# Patient Record
Sex: Male | Born: 1950 | Race: White | Hispanic: No | Marital: Married | Smoking: Current every day smoker
Health system: Southern US, Community
[De-identification: ages and names within clinical notes are randomized; demographics above are authoritative.]

## PROBLEM LIST (undated history)

## (undated) DIAGNOSIS — M1990 Unspecified osteoarthritis, unspecified site: Secondary | ICD-10-CM

## (undated) DIAGNOSIS — I219 Acute myocardial infarction, unspecified: Secondary | ICD-10-CM

## (undated) DIAGNOSIS — J449 Chronic obstructive pulmonary disease, unspecified: Secondary | ICD-10-CM

## (undated) DIAGNOSIS — E785 Hyperlipidemia, unspecified: Secondary | ICD-10-CM

## (undated) DIAGNOSIS — C801 Malignant (primary) neoplasm, unspecified: Secondary | ICD-10-CM

## (undated) HISTORY — DX: Acute myocardial infarction, unspecified: I21.9

## (undated) HISTORY — PX: EYE SURGERY: SHX253

## (undated) HISTORY — DX: Hyperlipidemia, unspecified: E78.5

## (undated) HISTORY — DX: Unspecified osteoarthritis, unspecified site: M19.90

## (undated) HISTORY — DX: Chronic obstructive pulmonary disease, unspecified: J44.9

## (undated) HISTORY — PX: HERNIA REPAIR: SHX51

## (undated) HISTORY — PX: BACK SURGERY: SHX140

## (undated) HISTORY — PX: NECK SURGERY: SHX720

## (undated) HISTORY — PX: REFRACTIVE SURGERY: SHX103

## (undated) HISTORY — PX: CATARACT EXTRACTION: SUR2

## (undated) HISTORY — DX: Malignant (primary) neoplasm, unspecified: C80.1

---

## 1997-11-02 ENCOUNTER — Ambulatory Visit (HOSPITAL_COMMUNITY): Admission: RE | Admit: 1997-11-02 | Discharge: 1997-11-02 | Payer: Self-pay

## 1999-08-21 ENCOUNTER — Inpatient Hospital Stay (HOSPITAL_COMMUNITY): Admission: EM | Admit: 1999-08-21 | Discharge: 1999-08-22 | Payer: Self-pay | Admitting: Cardiology

## 1999-08-22 ENCOUNTER — Encounter: Payer: Self-pay | Admitting: Cardiology

## 2000-07-15 ENCOUNTER — Emergency Department (HOSPITAL_COMMUNITY): Admission: EM | Admit: 2000-07-15 | Discharge: 2000-07-15 | Payer: Self-pay | Admitting: Emergency Medicine

## 2001-01-23 ENCOUNTER — Encounter: Payer: Self-pay | Admitting: Neurosurgery

## 2001-01-23 ENCOUNTER — Ambulatory Visit (HOSPITAL_COMMUNITY): Admission: RE | Admit: 2001-01-23 | Discharge: 2001-01-23 | Payer: Self-pay | Admitting: Neurosurgery

## 2001-03-20 ENCOUNTER — Ambulatory Visit (HOSPITAL_COMMUNITY): Admission: RE | Admit: 2001-03-20 | Discharge: 2001-03-20 | Payer: Self-pay | Admitting: Neurosurgery

## 2001-03-20 ENCOUNTER — Encounter: Payer: Self-pay | Admitting: Neurosurgery

## 2001-06-19 ENCOUNTER — Inpatient Hospital Stay (HOSPITAL_COMMUNITY): Admission: EM | Admit: 2001-06-19 | Discharge: 2001-06-20 | Payer: Self-pay | Admitting: *Deleted

## 2001-06-19 ENCOUNTER — Encounter: Payer: Self-pay | Admitting: Cardiology

## 2005-01-17 ENCOUNTER — Encounter: Admission: RE | Admit: 2005-01-17 | Discharge: 2005-01-17 | Payer: Self-pay | Admitting: Neurosurgery

## 2005-01-18 ENCOUNTER — Encounter: Admission: RE | Admit: 2005-01-18 | Discharge: 2005-01-18 | Payer: Self-pay | Admitting: Neurosurgery

## 2005-01-21 ENCOUNTER — Encounter: Admission: RE | Admit: 2005-01-21 | Discharge: 2005-01-21 | Payer: Self-pay | Admitting: Neurosurgery

## 2005-05-11 ENCOUNTER — Emergency Department (HOSPITAL_COMMUNITY): Admission: EM | Admit: 2005-05-11 | Discharge: 2005-05-11 | Payer: Self-pay | Admitting: *Deleted

## 2005-07-10 ENCOUNTER — Ambulatory Visit: Payer: Self-pay | Admitting: Cardiology

## 2005-07-12 ENCOUNTER — Inpatient Hospital Stay (HOSPITAL_BASED_OUTPATIENT_CLINIC_OR_DEPARTMENT_OTHER): Admission: RE | Admit: 2005-07-12 | Discharge: 2005-07-12 | Payer: Self-pay | Admitting: Internal Medicine

## 2005-07-12 ENCOUNTER — Ambulatory Visit: Payer: Self-pay | Admitting: Internal Medicine

## 2005-07-30 ENCOUNTER — Ambulatory Visit: Payer: Self-pay | Admitting: Cardiology

## 2005-08-10 ENCOUNTER — Ambulatory Visit: Payer: Self-pay | Admitting: Internal Medicine

## 2005-09-26 ENCOUNTER — Ambulatory Visit: Payer: Self-pay | Admitting: Internal Medicine

## 2005-10-18 ENCOUNTER — Ambulatory Visit: Payer: Self-pay | Admitting: Cardiology

## 2005-10-18 ENCOUNTER — Ambulatory Visit: Payer: Self-pay

## 2005-10-29 ENCOUNTER — Ambulatory Visit: Payer: Self-pay | Admitting: Internal Medicine

## 2007-08-29 IMAGING — CR DG CHEST 2V
3 series · 3 of 3 positions shown · non-contrast
Comparison: Digitized exam on 06/19/01.

CLINICAL DATA: 54-year-old with chest pain, fever, flu-like symptoms.
 CHEST ? 2 VIEW:

[view not recorded (1 of 3)]
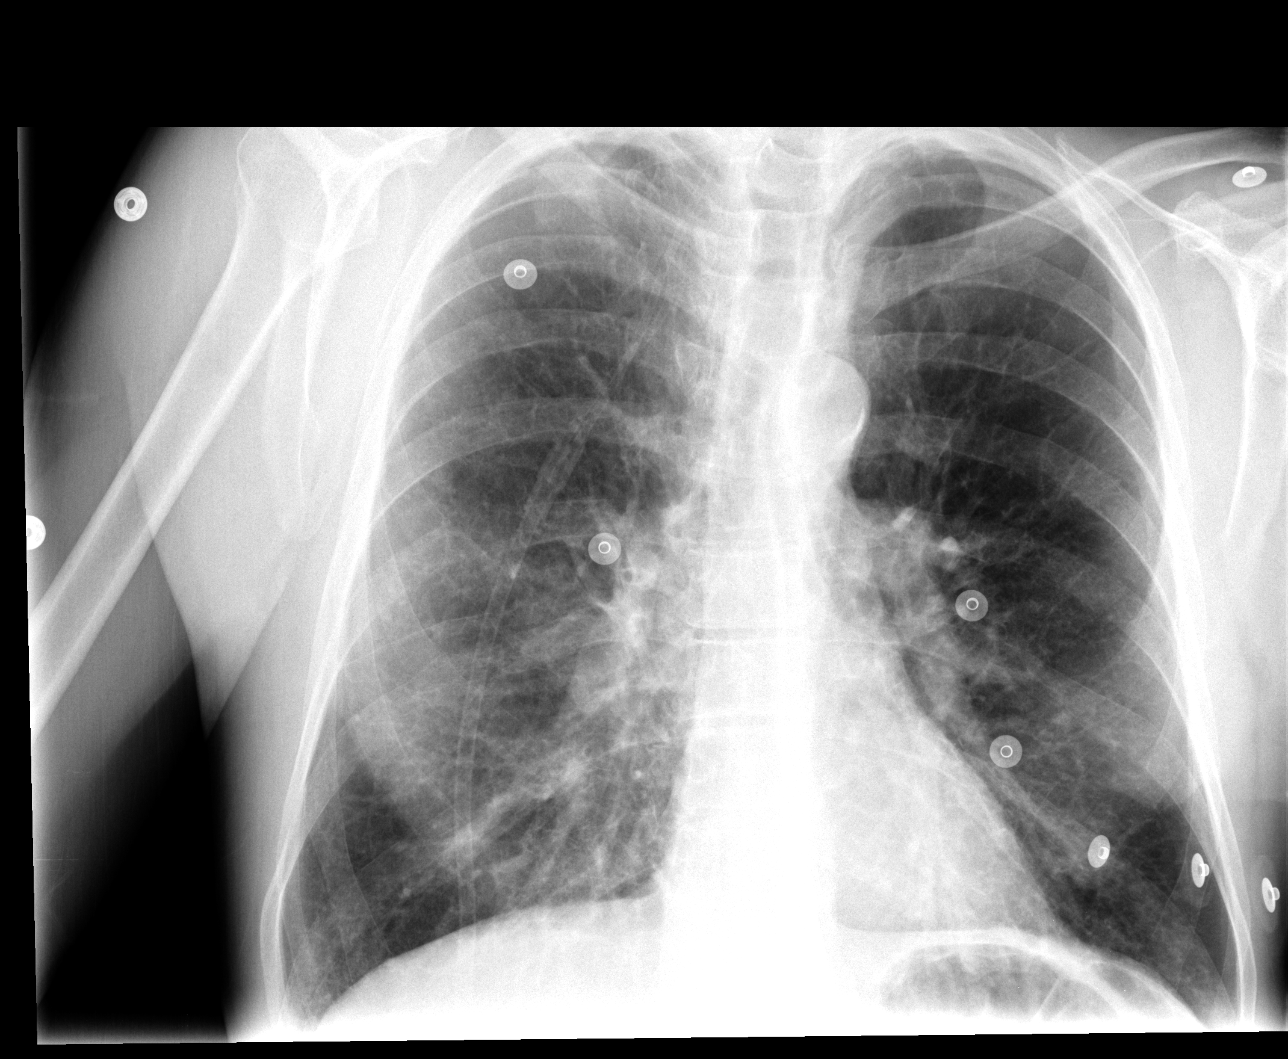

[view not recorded (2 of 3)]
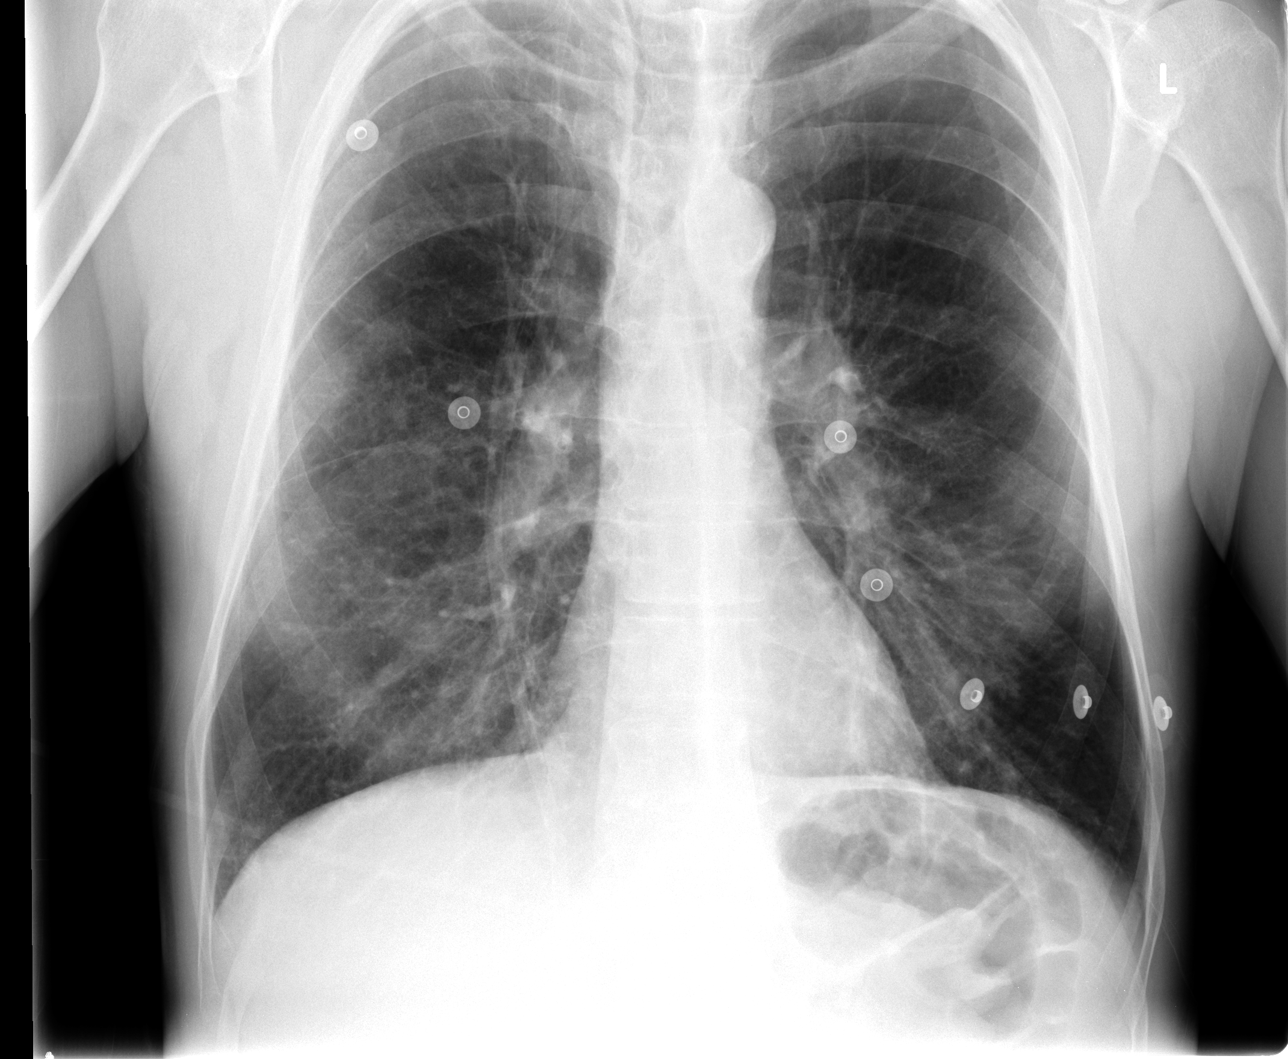

[view not recorded (3 of 3)]
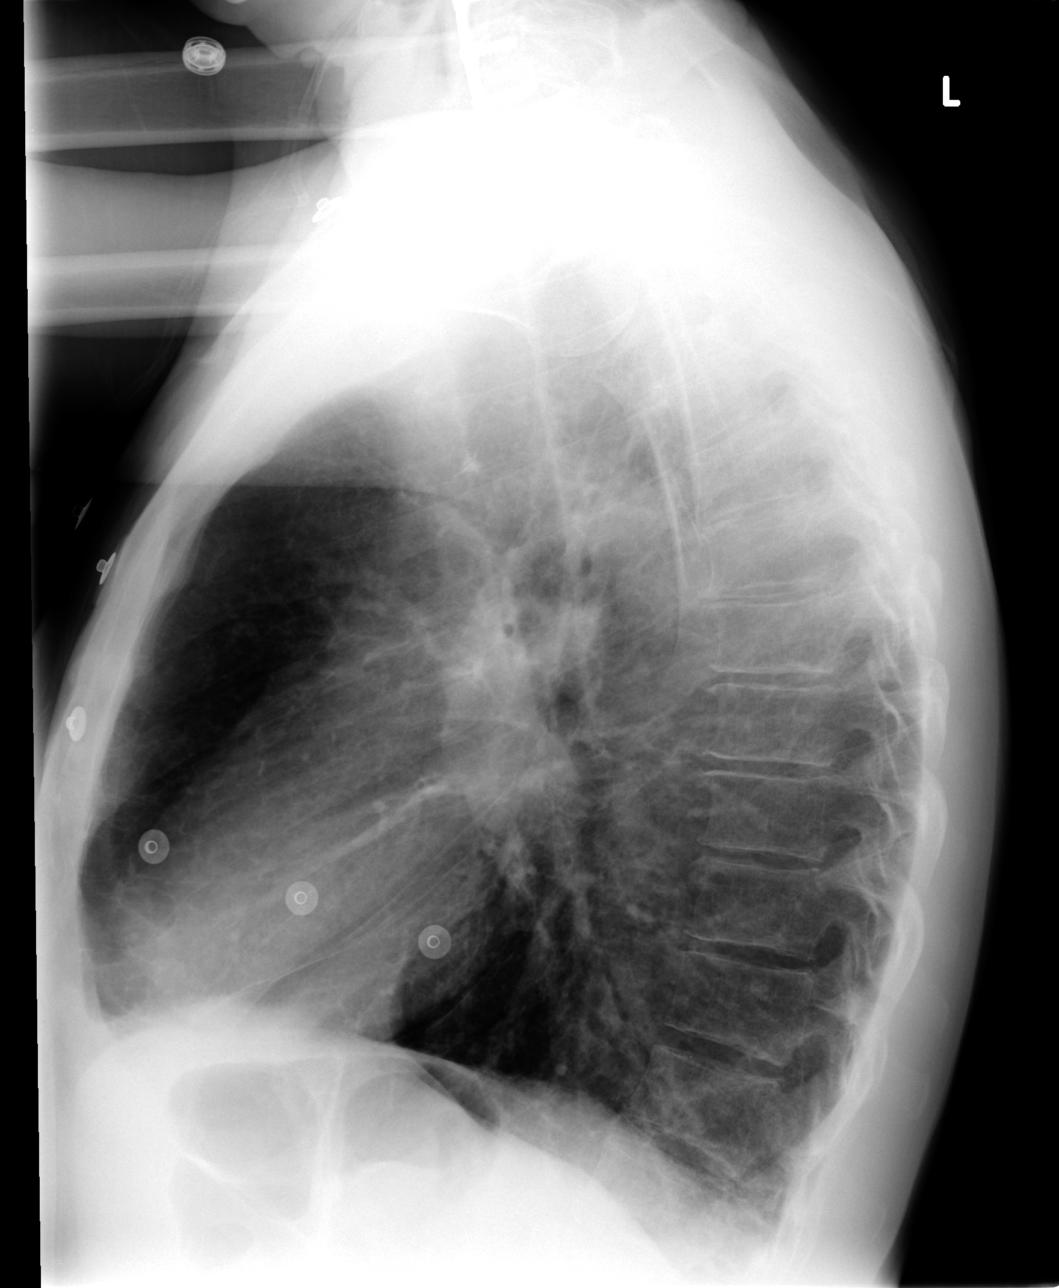

[3 of 3 positions shown; findings below may reference images not displayed]

FINDINGS: Heart is normal in size.  The lungs are hyperinflated.  There is prominence of interstitial markings, likely representing chronic interstitial lung disease.  No focal consolidation or pleural effusion identified.  Chronic bronchitic changes are noted.  Mild degenerative changes are seen in the spine.
IMPRESSION: Bronchitic changes in COPD.  No evidence for acute pulmonary abnormality.

## 2008-02-05 IMAGING — CT CT CHEST W/ CM
2 of 4 series · 15 of 36 positions shown, 18 images · IV contrast (APPLIED)
Comparison: none

HISTORY: Dyspnea, COPD, pulmonary hypertension, 25 pounds weight loss,
substernal pain and pressure

[Series 2: chest_routine 5.0 b40f st · axial · 0.63mm/px · z∈[-335,-40]mm · 12 of 69 slices shown, 15 images]
[im 5/69  mediastinal]
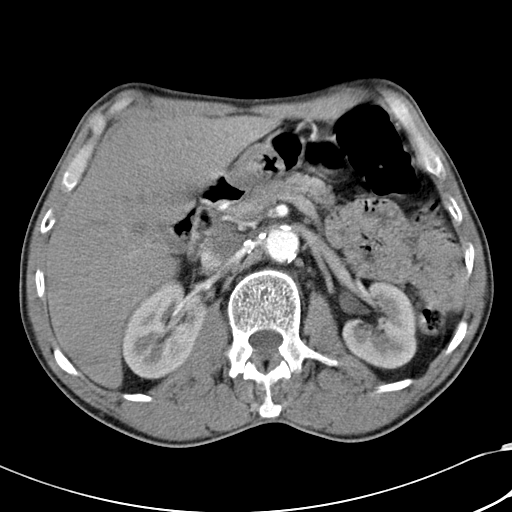
[im 5/69  lung]
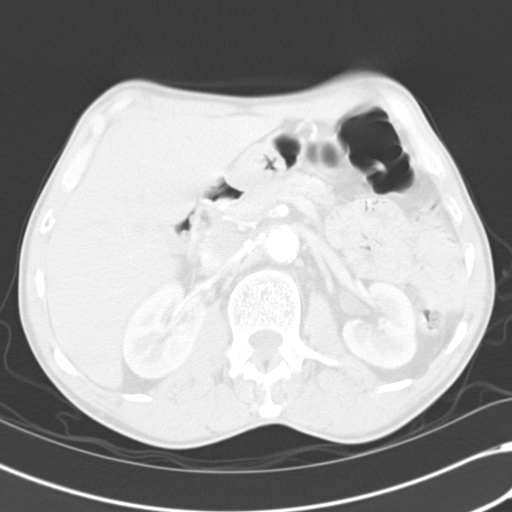
[im 10/69  lung]
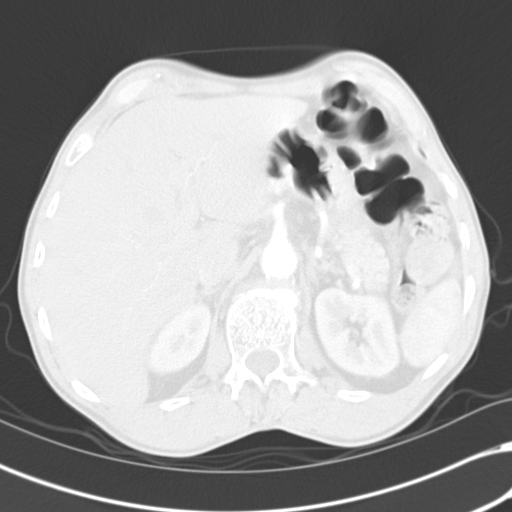
[im 15/69  lung]
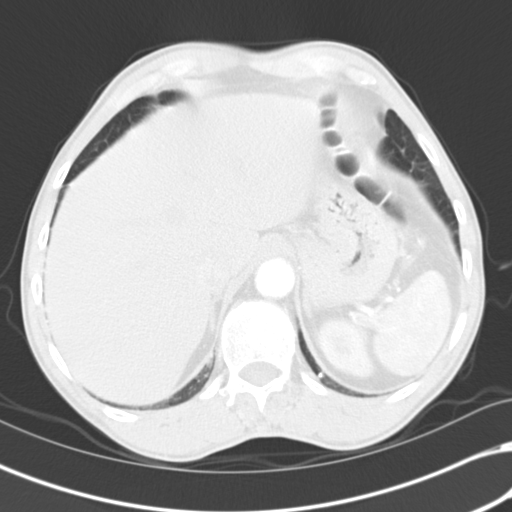
[im 20/69  lung]
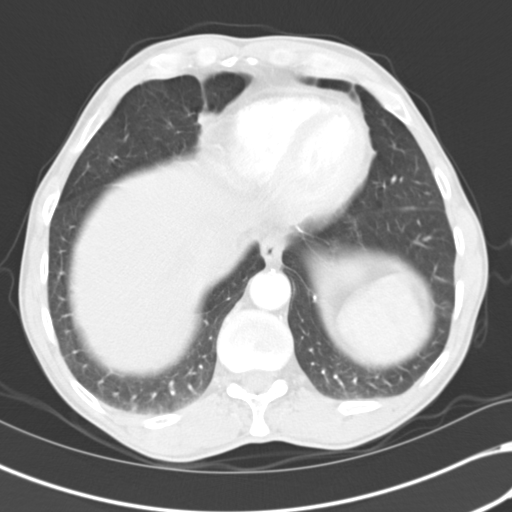
[im 25/69  mediastinal]
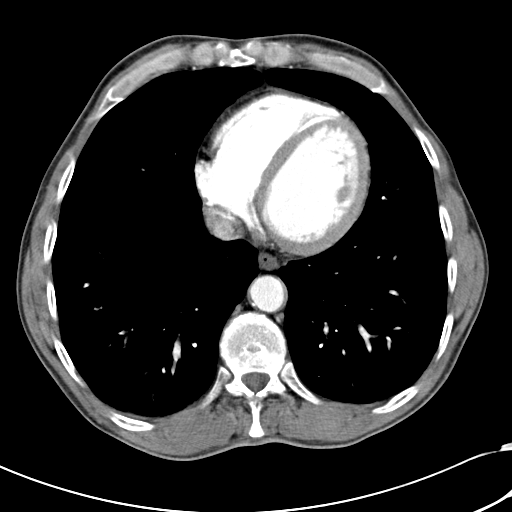
[im 25/69  lung]
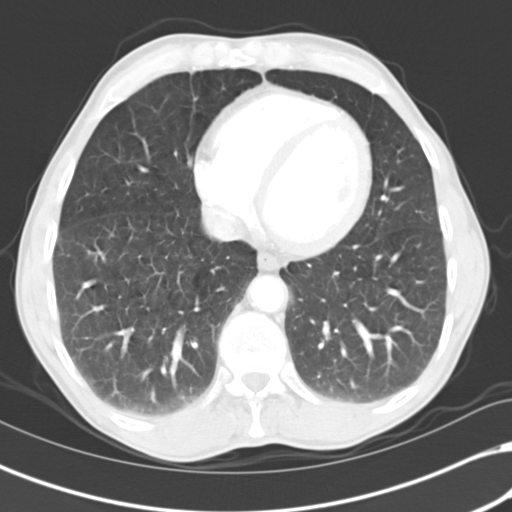
[im 30/69  lung]
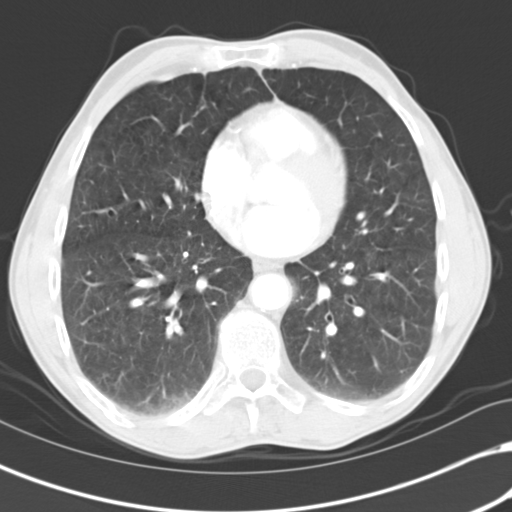
[im 39/69  lung]
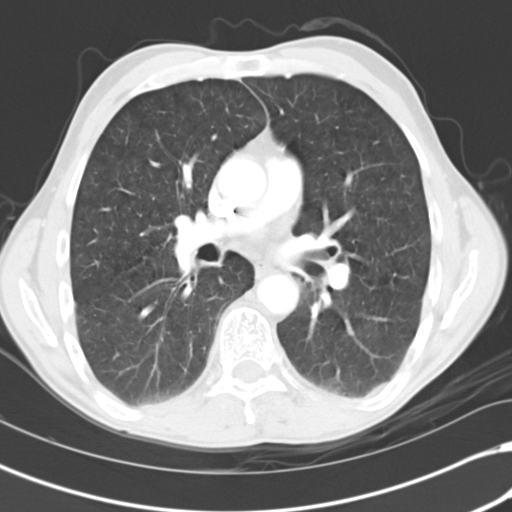
[im 44/69  lung]
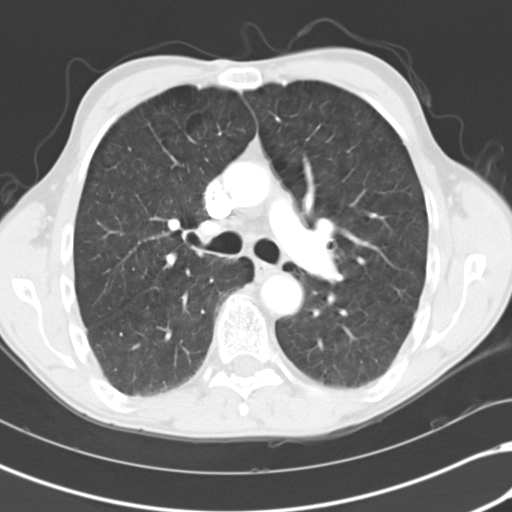
[im 49/69  mediastinal]
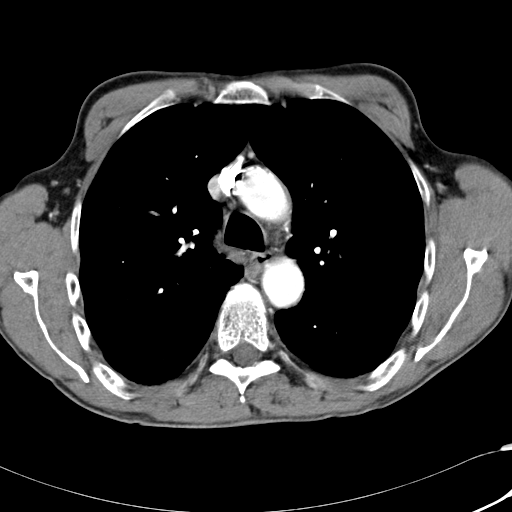
[im 49/69  lung]
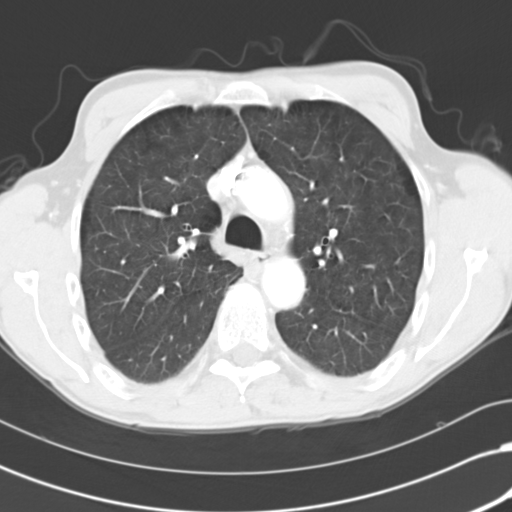
[im 54/69  lung]
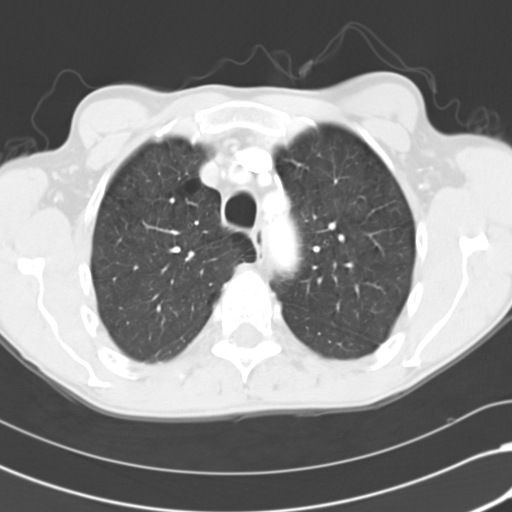
[im 59/69  lung]
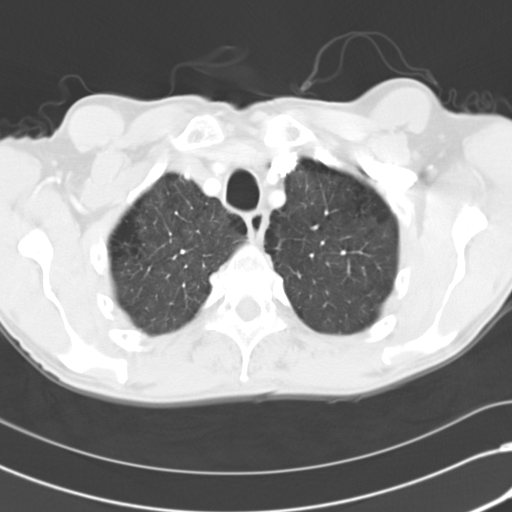
[im 64/69  lung]
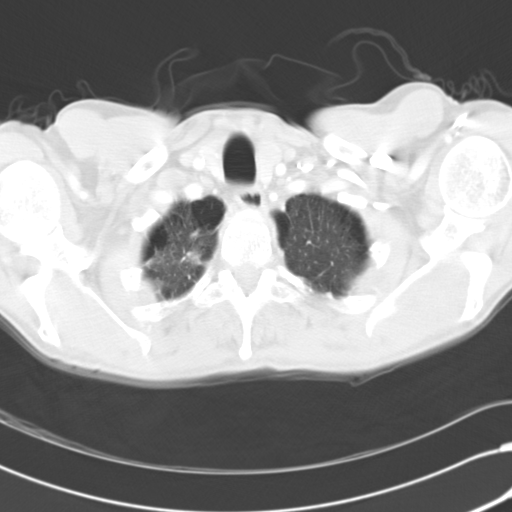

[Series 602: <mpr range> · coronal · 0.68mm/px · 3 of 39 slices shown]
[im 8/39  lung]
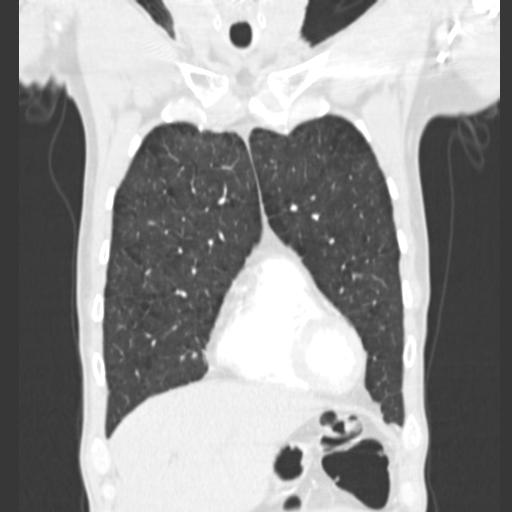
[im 16/39  lung]
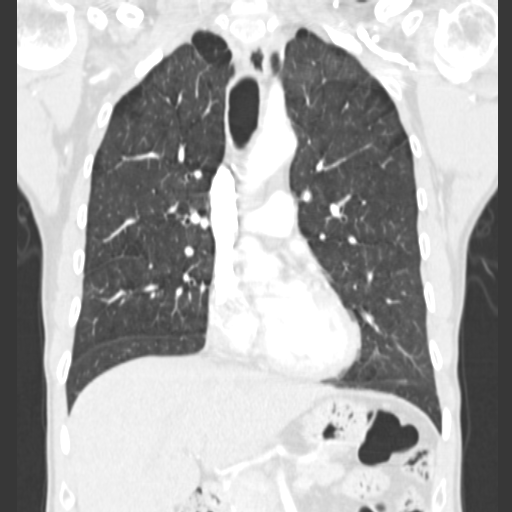
[im 23/39  lung]
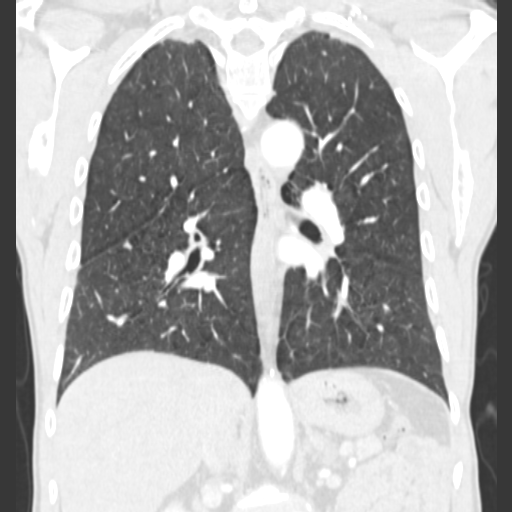

[15 of 36 positions shown; findings below may reference images not displayed]

CT CHEST WITH CONTRAST:

Multidetector helical CT imaging chest without priors for comparison.
Exam correlated with prior chest x-ray of 05/11/2005.
CT exam utilized 80 cc Mmnipaque-MAA.

Thoracic vascular structures patent.
Scattered normal sized mediastinal lymph nodes.
No thoracic adenopathy.
Scattered atherosclerotic calcifications aorta.
Visualized portion of upper abdomen unremarkable.
Stomach decompressed, unable to adequately assess wall thickness.
Biapical changes of COPD and parenchymal scarring.
Two tiny nonspecific nodular foci identified right lower lobe, 5 and 6 mm
diameter image 33, nonspecific.
Lungs otherwise clear.
Bones unremarkable.
IMPRESSION: COPD with 2 tiny nonspecific right lower lobe nodules.
Recommend followup CT chest in 6 months to establish stability.
No other significant thoracic abnormality.

## 2015-07-07 DIAGNOSIS — Z79899 Other long term (current) drug therapy: Secondary | ICD-10-CM | POA: Diagnosis not present

## 2015-07-07 DIAGNOSIS — Z87891 Personal history of nicotine dependence: Secondary | ICD-10-CM | POA: Diagnosis not present

## 2015-07-07 DIAGNOSIS — Z1389 Encounter for screening for other disorder: Secondary | ICD-10-CM | POA: Diagnosis not present

## 2015-07-07 DIAGNOSIS — J449 Chronic obstructive pulmonary disease, unspecified: Secondary | ICD-10-CM | POA: Diagnosis not present

## 2015-07-07 DIAGNOSIS — F329 Major depressive disorder, single episode, unspecified: Secondary | ICD-10-CM | POA: Diagnosis not present

## 2015-07-07 DIAGNOSIS — Z125 Encounter for screening for malignant neoplasm of prostate: Secondary | ICD-10-CM | POA: Diagnosis not present

## 2015-07-07 DIAGNOSIS — Z681 Body mass index (BMI) 19 or less, adult: Secondary | ICD-10-CM | POA: Diagnosis not present

## 2015-07-07 DIAGNOSIS — E7800 Pure hypercholesterolemia, unspecified: Secondary | ICD-10-CM | POA: Diagnosis not present

## 2015-07-21 DIAGNOSIS — R918 Other nonspecific abnormal finding of lung field: Secondary | ICD-10-CM | POA: Diagnosis not present

## 2015-07-21 DIAGNOSIS — Z122 Encounter for screening for malignant neoplasm of respiratory organs: Secondary | ICD-10-CM | POA: Diagnosis not present

## 2015-07-21 DIAGNOSIS — Z87891 Personal history of nicotine dependence: Secondary | ICD-10-CM | POA: Diagnosis not present

## 2015-07-21 DIAGNOSIS — I2510 Atherosclerotic heart disease of native coronary artery without angina pectoris: Secondary | ICD-10-CM | POA: Diagnosis not present

## 2015-07-21 DIAGNOSIS — J439 Emphysema, unspecified: Secondary | ICD-10-CM | POA: Diagnosis not present

## 2015-10-04 DIAGNOSIS — M539 Dorsopathy, unspecified: Secondary | ICD-10-CM | POA: Diagnosis not present

## 2015-10-04 DIAGNOSIS — Z681 Body mass index (BMI) 19 or less, adult: Secondary | ICD-10-CM | POA: Diagnosis not present

## 2015-10-04 DIAGNOSIS — Z79899 Other long term (current) drug therapy: Secondary | ICD-10-CM | POA: Diagnosis not present

## 2016-01-06 DIAGNOSIS — Z9181 History of falling: Secondary | ICD-10-CM | POA: Diagnosis not present

## 2016-01-06 DIAGNOSIS — F329 Major depressive disorder, single episode, unspecified: Secondary | ICD-10-CM | POA: Diagnosis not present

## 2016-01-06 DIAGNOSIS — M810 Age-related osteoporosis without current pathological fracture: Secondary | ICD-10-CM | POA: Diagnosis not present

## 2016-01-06 DIAGNOSIS — Z79899 Other long term (current) drug therapy: Secondary | ICD-10-CM | POA: Diagnosis not present

## 2016-01-06 DIAGNOSIS — F17200 Nicotine dependence, unspecified, uncomplicated: Secondary | ICD-10-CM | POA: Diagnosis not present

## 2016-01-06 DIAGNOSIS — Z720 Tobacco use: Secondary | ICD-10-CM | POA: Diagnosis not present

## 2016-01-06 DIAGNOSIS — R918 Other nonspecific abnormal finding of lung field: Secondary | ICD-10-CM | POA: Diagnosis not present

## 2016-01-06 DIAGNOSIS — J449 Chronic obstructive pulmonary disease, unspecified: Secondary | ICD-10-CM | POA: Diagnosis not present

## 2016-01-06 DIAGNOSIS — Z681 Body mass index (BMI) 19 or less, adult: Secondary | ICD-10-CM | POA: Diagnosis not present

## 2016-01-06 DIAGNOSIS — E7800 Pure hypercholesterolemia, unspecified: Secondary | ICD-10-CM | POA: Diagnosis not present

## 2016-01-20 DIAGNOSIS — F17210 Nicotine dependence, cigarettes, uncomplicated: Secondary | ICD-10-CM | POA: Diagnosis not present

## 2016-01-20 DIAGNOSIS — J439 Emphysema, unspecified: Secondary | ICD-10-CM | POA: Diagnosis not present

## 2016-01-20 DIAGNOSIS — Z122 Encounter for screening for malignant neoplasm of respiratory organs: Secondary | ICD-10-CM | POA: Diagnosis not present

## 2016-01-20 DIAGNOSIS — M4854XA Collapsed vertebra, not elsewhere classified, thoracic region, initial encounter for fracture: Secondary | ICD-10-CM | POA: Diagnosis not present

## 2016-01-20 DIAGNOSIS — Z87891 Personal history of nicotine dependence: Secondary | ICD-10-CM | POA: Diagnosis not present

## 2016-04-19 DIAGNOSIS — F419 Anxiety disorder, unspecified: Secondary | ICD-10-CM | POA: Diagnosis not present

## 2016-04-19 DIAGNOSIS — Z681 Body mass index (BMI) 19 or less, adult: Secondary | ICD-10-CM | POA: Diagnosis not present

## 2016-04-19 DIAGNOSIS — J449 Chronic obstructive pulmonary disease, unspecified: Secondary | ICD-10-CM | POA: Diagnosis not present

## 2016-04-19 DIAGNOSIS — E7800 Pure hypercholesterolemia, unspecified: Secondary | ICD-10-CM | POA: Diagnosis not present

## 2016-04-19 DIAGNOSIS — Z23 Encounter for immunization: Secondary | ICD-10-CM | POA: Diagnosis not present

## 2016-04-20 DIAGNOSIS — E7800 Pure hypercholesterolemia, unspecified: Secondary | ICD-10-CM | POA: Diagnosis not present

## 2016-05-24 DIAGNOSIS — I2510 Atherosclerotic heart disease of native coronary artery without angina pectoris: Secondary | ICD-10-CM

## 2016-05-24 HISTORY — DX: Atherosclerotic heart disease of native coronary artery without angina pectoris: I25.10

## 2016-05-28 DIAGNOSIS — I2510 Atherosclerotic heart disease of native coronary artery without angina pectoris: Secondary | ICD-10-CM | POA: Diagnosis not present

## 2016-05-29 DIAGNOSIS — I2510 Atherosclerotic heart disease of native coronary artery without angina pectoris: Secondary | ICD-10-CM | POA: Diagnosis not present

## 2016-07-16 DIAGNOSIS — I2510 Atherosclerotic heart disease of native coronary artery without angina pectoris: Secondary | ICD-10-CM | POA: Diagnosis not present

## 2016-07-16 DIAGNOSIS — E785 Hyperlipidemia, unspecified: Secondary | ICD-10-CM | POA: Diagnosis not present

## 2016-07-20 DIAGNOSIS — Z681 Body mass index (BMI) 19 or less, adult: Secondary | ICD-10-CM | POA: Diagnosis not present

## 2016-07-20 DIAGNOSIS — J449 Chronic obstructive pulmonary disease, unspecified: Secondary | ICD-10-CM | POA: Diagnosis not present

## 2016-07-20 DIAGNOSIS — Z1389 Encounter for screening for other disorder: Secondary | ICD-10-CM | POA: Diagnosis not present

## 2016-07-20 DIAGNOSIS — Z79899 Other long term (current) drug therapy: Secondary | ICD-10-CM | POA: Diagnosis not present

## 2016-07-20 DIAGNOSIS — E7800 Pure hypercholesterolemia, unspecified: Secondary | ICD-10-CM | POA: Diagnosis not present

## 2016-07-20 DIAGNOSIS — G4700 Insomnia, unspecified: Secondary | ICD-10-CM | POA: Diagnosis not present

## 2016-07-20 DIAGNOSIS — M545 Low back pain: Secondary | ICD-10-CM | POA: Diagnosis not present

## 2016-10-10 DIAGNOSIS — Z87891 Personal history of nicotine dependence: Secondary | ICD-10-CM | POA: Diagnosis not present

## 2016-10-10 DIAGNOSIS — J432 Centrilobular emphysema: Secondary | ICD-10-CM | POA: Diagnosis not present

## 2016-10-10 DIAGNOSIS — F17210 Nicotine dependence, cigarettes, uncomplicated: Secondary | ICD-10-CM | POA: Diagnosis not present

## 2016-10-10 DIAGNOSIS — R911 Solitary pulmonary nodule: Secondary | ICD-10-CM | POA: Diagnosis not present

## 2016-10-10 DIAGNOSIS — Z122 Encounter for screening for malignant neoplasm of respiratory organs: Secondary | ICD-10-CM | POA: Diagnosis not present

## 2016-10-25 DIAGNOSIS — Z79899 Other long term (current) drug therapy: Secondary | ICD-10-CM | POA: Diagnosis not present

## 2016-10-25 DIAGNOSIS — J449 Chronic obstructive pulmonary disease, unspecified: Secondary | ICD-10-CM | POA: Diagnosis not present

## 2016-10-25 DIAGNOSIS — E7800 Pure hypercholesterolemia, unspecified: Secondary | ICD-10-CM | POA: Diagnosis not present

## 2016-10-25 DIAGNOSIS — Z681 Body mass index (BMI) 19 or less, adult: Secondary | ICD-10-CM | POA: Diagnosis not present

## 2016-10-25 DIAGNOSIS — Z720 Tobacco use: Secondary | ICD-10-CM | POA: Diagnosis not present

## 2016-10-25 DIAGNOSIS — M5136 Other intervertebral disc degeneration, lumbar region: Secondary | ICD-10-CM | POA: Diagnosis not present

## 2016-10-25 DIAGNOSIS — F329 Major depressive disorder, single episode, unspecified: Secondary | ICD-10-CM | POA: Diagnosis not present

## 2016-12-21 DIAGNOSIS — Z125 Encounter for screening for malignant neoplasm of prostate: Secondary | ICD-10-CM | POA: Diagnosis not present

## 2016-12-21 DIAGNOSIS — Z136 Encounter for screening for cardiovascular disorders: Secondary | ICD-10-CM | POA: Diagnosis not present

## 2016-12-21 DIAGNOSIS — Z0000 Encounter for general adult medical examination without abnormal findings: Secondary | ICD-10-CM | POA: Diagnosis not present

## 2016-12-21 DIAGNOSIS — M810 Age-related osteoporosis without current pathological fracture: Secondary | ICD-10-CM | POA: Diagnosis not present

## 2016-12-21 DIAGNOSIS — E785 Hyperlipidemia, unspecified: Secondary | ICD-10-CM | POA: Diagnosis not present

## 2016-12-21 DIAGNOSIS — Z139 Encounter for screening, unspecified: Secondary | ICD-10-CM | POA: Diagnosis not present

## 2016-12-21 DIAGNOSIS — Z9181 History of falling: Secondary | ICD-10-CM | POA: Diagnosis not present

## 2016-12-21 DIAGNOSIS — Z1389 Encounter for screening for other disorder: Secondary | ICD-10-CM | POA: Diagnosis not present

## 2017-01-04 DIAGNOSIS — M810 Age-related osteoporosis without current pathological fracture: Secondary | ICD-10-CM | POA: Diagnosis not present

## 2017-01-10 DIAGNOSIS — E785 Hyperlipidemia, unspecified: Secondary | ICD-10-CM

## 2017-01-10 DIAGNOSIS — I10 Essential (primary) hypertension: Secondary | ICD-10-CM

## 2017-01-10 HISTORY — DX: Hyperlipidemia, unspecified: E78.5

## 2017-01-10 HISTORY — DX: Essential (primary) hypertension: I10

## 2017-01-31 DIAGNOSIS — Z79899 Other long term (current) drug therapy: Secondary | ICD-10-CM | POA: Diagnosis not present

## 2017-01-31 DIAGNOSIS — Z1389 Encounter for screening for other disorder: Secondary | ICD-10-CM | POA: Diagnosis not present

## 2017-01-31 DIAGNOSIS — M545 Low back pain: Secondary | ICD-10-CM | POA: Diagnosis not present

## 2017-01-31 DIAGNOSIS — Z681 Body mass index (BMI) 19 or less, adult: Secondary | ICD-10-CM | POA: Diagnosis not present

## 2017-01-31 DIAGNOSIS — E7800 Pure hypercholesterolemia, unspecified: Secondary | ICD-10-CM | POA: Diagnosis not present

## 2017-01-31 DIAGNOSIS — J449 Chronic obstructive pulmonary disease, unspecified: Secondary | ICD-10-CM | POA: Diagnosis not present

## 2017-01-31 DIAGNOSIS — F329 Major depressive disorder, single episode, unspecified: Secondary | ICD-10-CM | POA: Diagnosis not present

## 2017-01-31 DIAGNOSIS — Z9181 History of falling: Secondary | ICD-10-CM | POA: Diagnosis not present

## 2017-01-31 DIAGNOSIS — F419 Anxiety disorder, unspecified: Secondary | ICD-10-CM | POA: Diagnosis not present

## 2017-02-28 DIAGNOSIS — Z23 Encounter for immunization: Secondary | ICD-10-CM | POA: Diagnosis not present

## 2017-04-05 DIAGNOSIS — Z681 Body mass index (BMI) 19 or less, adult: Secondary | ICD-10-CM | POA: Diagnosis not present

## 2017-04-05 DIAGNOSIS — Z23 Encounter for immunization: Secondary | ICD-10-CM | POA: Diagnosis not present

## 2017-04-05 DIAGNOSIS — M5135 Other intervertebral disc degeneration, thoracolumbar region: Secondary | ICD-10-CM | POA: Diagnosis not present

## 2017-05-03 DIAGNOSIS — Z681 Body mass index (BMI) 19 or less, adult: Secondary | ICD-10-CM | POA: Diagnosis not present

## 2017-05-03 DIAGNOSIS — M545 Low back pain: Secondary | ICD-10-CM | POA: Diagnosis not present

## 2017-05-03 DIAGNOSIS — Z79899 Other long term (current) drug therapy: Secondary | ICD-10-CM | POA: Diagnosis not present

## 2017-05-03 DIAGNOSIS — M539 Dorsopathy, unspecified: Secondary | ICD-10-CM | POA: Diagnosis not present

## 2017-05-03 DIAGNOSIS — Z79891 Long term (current) use of opiate analgesic: Secondary | ICD-10-CM | POA: Diagnosis not present

## 2017-05-03 DIAGNOSIS — J449 Chronic obstructive pulmonary disease, unspecified: Secondary | ICD-10-CM | POA: Diagnosis not present

## 2017-06-03 DIAGNOSIS — R69 Illness, unspecified: Secondary | ICD-10-CM | POA: Diagnosis not present

## 2017-06-03 DIAGNOSIS — Z79899 Other long term (current) drug therapy: Secondary | ICD-10-CM | POA: Diagnosis not present

## 2017-06-03 DIAGNOSIS — Z681 Body mass index (BMI) 19 or less, adult: Secondary | ICD-10-CM | POA: Diagnosis not present

## 2017-06-03 DIAGNOSIS — M539 Dorsopathy, unspecified: Secondary | ICD-10-CM | POA: Diagnosis not present

## 2017-07-04 DIAGNOSIS — M545 Low back pain: Secondary | ICD-10-CM | POA: Diagnosis not present

## 2017-07-04 DIAGNOSIS — R69 Illness, unspecified: Secondary | ICD-10-CM | POA: Diagnosis not present

## 2017-07-04 DIAGNOSIS — J449 Chronic obstructive pulmonary disease, unspecified: Secondary | ICD-10-CM | POA: Diagnosis not present

## 2017-07-04 DIAGNOSIS — Z79891 Long term (current) use of opiate analgesic: Secondary | ICD-10-CM | POA: Diagnosis not present

## 2017-07-04 DIAGNOSIS — Z87891 Personal history of nicotine dependence: Secondary | ICD-10-CM | POA: Diagnosis not present

## 2017-07-04 DIAGNOSIS — M62838 Other muscle spasm: Secondary | ICD-10-CM | POA: Diagnosis not present

## 2017-07-04 DIAGNOSIS — M539 Dorsopathy, unspecified: Secondary | ICD-10-CM | POA: Diagnosis not present

## 2017-07-10 DIAGNOSIS — E785 Hyperlipidemia, unspecified: Secondary | ICD-10-CM | POA: Diagnosis not present

## 2017-07-10 DIAGNOSIS — R69 Illness, unspecified: Secondary | ICD-10-CM | POA: Diagnosis not present

## 2017-07-10 DIAGNOSIS — I2510 Atherosclerotic heart disease of native coronary artery without angina pectoris: Secondary | ICD-10-CM | POA: Diagnosis not present

## 2017-07-10 DIAGNOSIS — F17200 Nicotine dependence, unspecified, uncomplicated: Secondary | ICD-10-CM | POA: Insufficient documentation

## 2017-07-10 DIAGNOSIS — I10 Essential (primary) hypertension: Secondary | ICD-10-CM | POA: Diagnosis not present

## 2017-08-01 DIAGNOSIS — Z681 Body mass index (BMI) 19 or less, adult: Secondary | ICD-10-CM | POA: Diagnosis not present

## 2017-08-01 DIAGNOSIS — M545 Low back pain: Secondary | ICD-10-CM | POA: Diagnosis not present

## 2017-08-01 DIAGNOSIS — E7800 Pure hypercholesterolemia, unspecified: Secondary | ICD-10-CM | POA: Diagnosis not present

## 2017-08-01 DIAGNOSIS — Z79899 Other long term (current) drug therapy: Secondary | ICD-10-CM | POA: Diagnosis not present

## 2017-08-01 DIAGNOSIS — Z9181 History of falling: Secondary | ICD-10-CM | POA: Diagnosis not present

## 2017-08-01 DIAGNOSIS — R69 Illness, unspecified: Secondary | ICD-10-CM | POA: Diagnosis not present

## 2017-08-01 DIAGNOSIS — J449 Chronic obstructive pulmonary disease, unspecified: Secondary | ICD-10-CM | POA: Diagnosis not present

## 2017-09-03 DIAGNOSIS — Z681 Body mass index (BMI) 19 or less, adult: Secondary | ICD-10-CM | POA: Diagnosis not present

## 2017-09-03 DIAGNOSIS — M539 Dorsopathy, unspecified: Secondary | ICD-10-CM | POA: Diagnosis not present

## 2017-09-03 DIAGNOSIS — M545 Low back pain: Secondary | ICD-10-CM | POA: Diagnosis not present

## 2017-09-03 DIAGNOSIS — Z79899 Other long term (current) drug therapy: Secondary | ICD-10-CM | POA: Diagnosis not present

## 2017-09-03 DIAGNOSIS — J449 Chronic obstructive pulmonary disease, unspecified: Secondary | ICD-10-CM | POA: Diagnosis not present

## 2017-09-03 DIAGNOSIS — R69 Illness, unspecified: Secondary | ICD-10-CM | POA: Diagnosis not present

## 2017-09-03 DIAGNOSIS — M62838 Other muscle spasm: Secondary | ICD-10-CM | POA: Diagnosis not present

## 2017-10-03 DIAGNOSIS — Z79891 Long term (current) use of opiate analgesic: Secondary | ICD-10-CM | POA: Diagnosis not present

## 2017-10-03 DIAGNOSIS — M539 Dorsopathy, unspecified: Secondary | ICD-10-CM | POA: Diagnosis not present

## 2017-10-03 DIAGNOSIS — Z681 Body mass index (BMI) 19 or less, adult: Secondary | ICD-10-CM | POA: Diagnosis not present

## 2017-10-03 DIAGNOSIS — Z79899 Other long term (current) drug therapy: Secondary | ICD-10-CM | POA: Diagnosis not present

## 2017-10-03 DIAGNOSIS — J449 Chronic obstructive pulmonary disease, unspecified: Secondary | ICD-10-CM | POA: Diagnosis not present

## 2017-11-05 DIAGNOSIS — R252 Cramp and spasm: Secondary | ICD-10-CM | POA: Diagnosis not present

## 2017-11-05 DIAGNOSIS — Z681 Body mass index (BMI) 19 or less, adult: Secondary | ICD-10-CM | POA: Diagnosis not present

## 2017-11-05 DIAGNOSIS — M539 Dorsopathy, unspecified: Secondary | ICD-10-CM | POA: Diagnosis not present

## 2017-11-05 DIAGNOSIS — J449 Chronic obstructive pulmonary disease, unspecified: Secondary | ICD-10-CM | POA: Diagnosis not present

## 2017-11-05 DIAGNOSIS — Z79891 Long term (current) use of opiate analgesic: Secondary | ICD-10-CM | POA: Diagnosis not present

## 2017-11-05 DIAGNOSIS — Z87891 Personal history of nicotine dependence: Secondary | ICD-10-CM | POA: Diagnosis not present

## 2017-11-05 DIAGNOSIS — Z79899 Other long term (current) drug therapy: Secondary | ICD-10-CM | POA: Diagnosis not present

## 2017-11-07 DIAGNOSIS — Z87891 Personal history of nicotine dependence: Secondary | ICD-10-CM | POA: Diagnosis not present

## 2017-11-07 DIAGNOSIS — R69 Illness, unspecified: Secondary | ICD-10-CM | POA: Diagnosis not present

## 2017-12-05 DIAGNOSIS — J449 Chronic obstructive pulmonary disease, unspecified: Secondary | ICD-10-CM | POA: Diagnosis not present

## 2017-12-05 DIAGNOSIS — Z1339 Encounter for screening examination for other mental health and behavioral disorders: Secondary | ICD-10-CM | POA: Diagnosis not present

## 2017-12-05 DIAGNOSIS — Z79899 Other long term (current) drug therapy: Secondary | ICD-10-CM | POA: Diagnosis not present

## 2017-12-05 DIAGNOSIS — Z79891 Long term (current) use of opiate analgesic: Secondary | ICD-10-CM | POA: Diagnosis not present

## 2017-12-05 DIAGNOSIS — R69 Illness, unspecified: Secondary | ICD-10-CM | POA: Diagnosis not present

## 2017-12-05 DIAGNOSIS — M5412 Radiculopathy, cervical region: Secondary | ICD-10-CM | POA: Diagnosis not present

## 2017-12-05 DIAGNOSIS — Z720 Tobacco use: Secondary | ICD-10-CM | POA: Diagnosis not present

## 2017-12-05 DIAGNOSIS — M539 Dorsopathy, unspecified: Secondary | ICD-10-CM | POA: Diagnosis not present

## 2018-01-06 DIAGNOSIS — R69 Illness, unspecified: Secondary | ICD-10-CM | POA: Diagnosis not present

## 2018-01-06 DIAGNOSIS — J449 Chronic obstructive pulmonary disease, unspecified: Secondary | ICD-10-CM | POA: Diagnosis not present

## 2018-01-06 DIAGNOSIS — M539 Dorsopathy, unspecified: Secondary | ICD-10-CM | POA: Diagnosis not present

## 2018-01-06 DIAGNOSIS — Z79899 Other long term (current) drug therapy: Secondary | ICD-10-CM | POA: Diagnosis not present

## 2018-01-06 DIAGNOSIS — M5412 Radiculopathy, cervical region: Secondary | ICD-10-CM | POA: Diagnosis not present

## 2018-02-03 DIAGNOSIS — R69 Illness, unspecified: Secondary | ICD-10-CM | POA: Diagnosis not present

## 2018-02-03 DIAGNOSIS — Z79891 Long term (current) use of opiate analgesic: Secondary | ICD-10-CM | POA: Diagnosis not present

## 2018-02-03 DIAGNOSIS — M5412 Radiculopathy, cervical region: Secondary | ICD-10-CM | POA: Diagnosis not present

## 2018-02-03 DIAGNOSIS — Z681 Body mass index (BMI) 19 or less, adult: Secondary | ICD-10-CM | POA: Diagnosis not present

## 2018-02-03 DIAGNOSIS — M539 Dorsopathy, unspecified: Secondary | ICD-10-CM | POA: Diagnosis not present

## 2018-02-03 DIAGNOSIS — M545 Low back pain: Secondary | ICD-10-CM | POA: Diagnosis not present

## 2018-02-03 DIAGNOSIS — J449 Chronic obstructive pulmonary disease, unspecified: Secondary | ICD-10-CM | POA: Diagnosis not present

## 2018-02-04 DIAGNOSIS — J439 Emphysema, unspecified: Secondary | ICD-10-CM | POA: Diagnosis not present

## 2018-02-04 DIAGNOSIS — M545 Low back pain: Secondary | ICD-10-CM | POA: Diagnosis not present

## 2018-02-04 DIAGNOSIS — M438X6 Other specified deforming dorsopathies, lumbar region: Secondary | ICD-10-CM | POA: Diagnosis not present

## 2018-02-04 DIAGNOSIS — R0602 Shortness of breath: Secondary | ICD-10-CM | POA: Diagnosis not present

## 2018-02-13 DIAGNOSIS — Z136 Encounter for screening for cardiovascular disorders: Secondary | ICD-10-CM | POA: Diagnosis not present

## 2018-02-13 DIAGNOSIS — Z125 Encounter for screening for malignant neoplasm of prostate: Secondary | ICD-10-CM | POA: Diagnosis not present

## 2018-02-13 DIAGNOSIS — Z23 Encounter for immunization: Secondary | ICD-10-CM | POA: Diagnosis not present

## 2018-02-13 DIAGNOSIS — Z0000 Encounter for general adult medical examination without abnormal findings: Secondary | ICD-10-CM | POA: Diagnosis not present

## 2018-02-13 DIAGNOSIS — Z139 Encounter for screening, unspecified: Secondary | ICD-10-CM | POA: Diagnosis not present

## 2018-02-13 DIAGNOSIS — Z1331 Encounter for screening for depression: Secondary | ICD-10-CM | POA: Diagnosis not present

## 2018-02-13 DIAGNOSIS — E785 Hyperlipidemia, unspecified: Secondary | ICD-10-CM | POA: Diagnosis not present

## 2018-03-05 DIAGNOSIS — M539 Dorsopathy, unspecified: Secondary | ICD-10-CM | POA: Diagnosis not present

## 2018-03-05 DIAGNOSIS — Z681 Body mass index (BMI) 19 or less, adult: Secondary | ICD-10-CM | POA: Diagnosis not present

## 2018-03-05 DIAGNOSIS — M5412 Radiculopathy, cervical region: Secondary | ICD-10-CM | POA: Diagnosis not present

## 2018-03-05 DIAGNOSIS — J449 Chronic obstructive pulmonary disease, unspecified: Secondary | ICD-10-CM | POA: Diagnosis not present

## 2018-04-10 DIAGNOSIS — M5412 Radiculopathy, cervical region: Secondary | ICD-10-CM | POA: Diagnosis not present

## 2018-04-10 DIAGNOSIS — R69 Illness, unspecified: Secondary | ICD-10-CM | POA: Diagnosis not present

## 2018-04-10 DIAGNOSIS — Z79899 Other long term (current) drug therapy: Secondary | ICD-10-CM | POA: Diagnosis not present

## 2018-04-10 DIAGNOSIS — J449 Chronic obstructive pulmonary disease, unspecified: Secondary | ICD-10-CM | POA: Diagnosis not present

## 2018-04-10 DIAGNOSIS — M539 Dorsopathy, unspecified: Secondary | ICD-10-CM | POA: Diagnosis not present

## 2018-04-10 DIAGNOSIS — Z681 Body mass index (BMI) 19 or less, adult: Secondary | ICD-10-CM | POA: Diagnosis not present

## 2018-05-08 DIAGNOSIS — M539 Dorsopathy, unspecified: Secondary | ICD-10-CM | POA: Diagnosis not present

## 2018-05-08 DIAGNOSIS — M5412 Radiculopathy, cervical region: Secondary | ICD-10-CM | POA: Diagnosis not present

## 2018-05-08 DIAGNOSIS — Z79899 Other long term (current) drug therapy: Secondary | ICD-10-CM | POA: Diagnosis not present

## 2018-05-08 DIAGNOSIS — J449 Chronic obstructive pulmonary disease, unspecified: Secondary | ICD-10-CM | POA: Diagnosis not present

## 2018-05-08 DIAGNOSIS — Z681 Body mass index (BMI) 19 or less, adult: Secondary | ICD-10-CM | POA: Diagnosis not present

## 2018-05-08 DIAGNOSIS — I25119 Atherosclerotic heart disease of native coronary artery with unspecified angina pectoris: Secondary | ICD-10-CM | POA: Diagnosis not present

## 2018-05-08 DIAGNOSIS — R69 Illness, unspecified: Secondary | ICD-10-CM | POA: Diagnosis not present

## 2018-06-09 DIAGNOSIS — M5412 Radiculopathy, cervical region: Secondary | ICD-10-CM | POA: Diagnosis not present

## 2018-06-09 DIAGNOSIS — M539 Dorsopathy, unspecified: Secondary | ICD-10-CM | POA: Diagnosis not present

## 2018-06-09 DIAGNOSIS — J449 Chronic obstructive pulmonary disease, unspecified: Secondary | ICD-10-CM | POA: Diagnosis not present

## 2018-07-10 DIAGNOSIS — M5412 Radiculopathy, cervical region: Secondary | ICD-10-CM | POA: Diagnosis not present

## 2018-07-10 DIAGNOSIS — R252 Cramp and spasm: Secondary | ICD-10-CM | POA: Diagnosis not present

## 2018-07-10 DIAGNOSIS — M539 Dorsopathy, unspecified: Secondary | ICD-10-CM | POA: Diagnosis not present

## 2018-07-10 DIAGNOSIS — J449 Chronic obstructive pulmonary disease, unspecified: Secondary | ICD-10-CM | POA: Diagnosis not present

## 2019-04-02 ENCOUNTER — Other Ambulatory Visit: Payer: Self-pay | Admitting: Sports Medicine

## 2019-04-02 ENCOUNTER — Other Ambulatory Visit: Payer: Self-pay

## 2019-04-02 ENCOUNTER — Ambulatory Visit: Payer: Medicare Other | Admitting: Sports Medicine

## 2019-04-02 ENCOUNTER — Encounter: Payer: Self-pay | Admitting: Sports Medicine

## 2019-04-02 DIAGNOSIS — M7752 Other enthesopathy of left foot: Secondary | ICD-10-CM | POA: Diagnosis not present

## 2019-04-02 DIAGNOSIS — M25572 Pain in left ankle and joints of left foot: Secondary | ICD-10-CM

## 2019-04-02 DIAGNOSIS — M19079 Primary osteoarthritis, unspecified ankle and foot: Secondary | ICD-10-CM | POA: Diagnosis not present

## 2019-04-02 MED ORDER — TRIAMCINOLONE ACETONIDE 10 MG/ML IJ SUSP
10.0000 mg | Freq: Once | INTRAMUSCULAR | Status: AC
  Administered 2019-04-02: 10 mg

## 2019-04-02 NOTE — Progress Notes (Signed)
Subjective:  Corey Webb is a 68 y.o. male patient who presents to office for evaluation of left ankle pain. Patient complains of continued pain in the ankle for the last 8 or 9 months reports that the pain is 7-8 out of 10 sharp in nature reports that it constantly hurts worse when he is walking or standing reports that he was seen by orthopedics who gave him a brace and ordered a MRI.  Patient also reports that his PCP has given him anti-inflammatory of meloxicam but it did not help.  Patient denies any injury or trauma to the ankle.  No other pedal complaints noted.  ROS  There are no active problems to display for this patient.   Current Outpatient Medications on File Prior to Visit  Medication Sig Dispense Refill  . albuterol (ACCUNEB) 0.63 MG/3ML nebulizer solution Take 1 ampule by nebulization every 6 (six) hours as needed for wheezing.    Marland Kitchen aspirin 325 MG tablet Take by mouth.    . DULoxetine (CYMBALTA) 60 MG capsule TAKE 1 CAPSULE BY MOUTH DAILY    . meloxicam (MOBIC) 15 MG tablet Take 15 mg by mouth daily.    . Oxycodone HCl 10 MG TABS     . pravastatin (PRAVACHOL) 40 MG tablet TAKE 1 TABLET BY MOUTH AT BEDTIME FOR HIGH CHOLESTEROL    . verapamil (VERELAN PM) 180 MG 24 hr capsule Take by mouth.     No current facility-administered medications on file prior to visit.     Allergies  Allergen Reactions  . Atorvastatin Other (See Comments)    Leg pain Leg pain   . Clonazepam Other (See Comments)  . Ropinirole Other (See Comments)  . Varenicline Other (See Comments)    Objective:  General: Alert and oriented x3 in no acute distress  Dermatology: No open lesions bilateral lower extremities, no webspace macerations, no ecchymosis bilateral, all nails x 10 are well manicured.  Vascular: Dorsalis Pedis and Posterior Tibial pedal pulses palpable, varicosities bilateral.  Capillary Fill Time 5 seconds, scant pedal hair growth bilateral, no edema bilateral lower extremities,  Temperature gradient within normal limits.  Neurology: Gross sensation intact via light touch bilateral.  Musculoskeletal: Mild tenderness with palpation at left medial ankle.  Negative talar tilt, Negative tib-fib stress, No instability. No pain with calf compression bilateral. Range of motion within normal limits with mild guarding on left medial ankle. Strength within normal limits in all groups bilateral.   Gait: Antalgic gait  MRI 10/12 achilles bursiitis and mininal arthritis and ankle and subtalar jt  Assessment and Plan: Problem List Items Addressed This Visit    None    Visit Diagnoses    Capsulitis of ankle, left    -  Primary   Relevant Medications   aspirin 325 MG tablet   meloxicam (MOBIC) 15 MG tablet   Oxycodone HCl 10 MG TABS   Left ankle pain, unspecified chronicity       Arthritis of ankle           -Complete examination performed -MRI reviewed -Discussed treatment options capsulitis versus arthritis -After oral consent and aseptic prep, injected a mixture containing 1 ml of 2%  plain lidocaine, 1 ml 0.5% plain marcaine, 0.5 ml of kenalog 10 and 0.5 ml of dexamethasone phosphate into medial ankle without complication. Post-injection care discussed with patient.  -Advised patient to continue with ankle brace for at least 2 weeks and then after may slowly wean if his symptoms are doing better -  Continue with chronic pain medication of Oxy 10 which he takes for his chronic back issues -Continue with good supportive shoes daily -Patient to return to office if fails to improve after 2 weeks or sooner if condition worsens.  Landis Martins, DPM

## 2019-08-10 HISTORY — PX: HERNIA REPAIR: SHX51

## 2020-02-03 ENCOUNTER — Ambulatory Visit: Payer: Medicare Other | Admitting: Sports Medicine

## 2020-02-17 ENCOUNTER — Encounter: Payer: Self-pay | Admitting: Sports Medicine

## 2020-02-17 ENCOUNTER — Ambulatory Visit: Payer: Medicare Other | Admitting: Sports Medicine

## 2020-02-17 ENCOUNTER — Other Ambulatory Visit: Payer: Self-pay

## 2020-02-17 DIAGNOSIS — M25572 Pain in left ankle and joints of left foot: Secondary | ICD-10-CM | POA: Diagnosis not present

## 2020-02-17 DIAGNOSIS — M7752 Other enthesopathy of left foot: Secondary | ICD-10-CM

## 2020-02-17 DIAGNOSIS — M19079 Primary osteoarthritis, unspecified ankle and foot: Secondary | ICD-10-CM

## 2020-02-17 MED ORDER — DICLOFENAC SODIUM 75 MG PO TBEC: 75.0000 mg | DELAYED_RELEASE_TABLET | Freq: Two times a day (BID) | ORAL | 0 refills | Status: DC

## 2020-02-17 MED ORDER — TRIAMCINOLONE ACETONIDE 10 MG/ML IJ SUSP: 10.0000 mg | Freq: Once | INTRAMUSCULAR | Status: AC

## 2020-02-17 NOTE — Progress Notes (Signed)
Subjective:  Corey Webb is a 69 y.o. male patient who returns to office for follow up evaluation of left ankle pain.  Patient reports his ankle is not any better reports that the last injection helped for a little while but most days sore and tender and it hurts when he gets ready to step or walk on the foot and also it hurts with change in weather.  Patient also reports that his foot feels cold denies any numbness tingling or burning but does have a significant history of back problems with radiating pain.  No other pedal complaints noted.  Patient is assisted by wife this visit.  Patient Active Problem List   Diagnosis Date Noted  . Smoking 07/10/2017  . Dyslipidemia 01/10/2017  . Essential hypertension 01/10/2017  . CAD in native artery 05/24/2016    Current Outpatient Medications on File Prior to Visit  Medication Sig Dispense Refill  . acetaminophen (TYLENOL) 500 MG tablet Take 500 mg by mouth every 6 (six) hours as needed.    Marland Kitchen albuterol (ACCUNEB) 0.63 MG/3ML nebulizer solution Take 1 ampule by nebulization every 6 (six) hours as needed for wheezing.    Marland Kitchen aspirin 325 MG tablet Take by mouth.    . DULoxetine (CYMBALTA) 60 MG capsule TAKE 1 CAPSULE BY MOUTH DAILY    . Oxycodone HCl 10 MG TABS     . pravastatin (PRAVACHOL) 40 MG tablet TAKE 1 TABLET BY MOUTH AT BEDTIME FOR HIGH CHOLESTEROL    . verapamil (VERELAN PM) 180 MG 24 hr capsule Take by mouth.     No current facility-administered medications on file prior to visit.    Allergies  Allergen Reactions  . Atorvastatin Other (See Comments)    Leg pain Leg pain   . Clonazepam Other (See Comments)  . Ropinirole Other (See Comments)  . Varenicline Other (See Comments)    Objective:  General: Alert and oriented x3 in no acute distress  Dermatology: No open lesions bilateral lower extremities, no webspace macerations, no ecchymosis bilateral, all nails x 10 are well manicured.  Vascular: Dorsalis Pedis and Posterior  Tibial pedal pulses palpable, varicosities bilateral.  Capillary Fill Time 5 seconds, scant pedal hair growth bilateral, no edema bilateral lower extremities, Temperature gradient within normal limits but subjectively cold feet.  Neurology: Johney Maine sensation intact via light touch bilateral.  Musculoskeletal: Mild tenderness with palpation at left medial greater than lateral ankle.  Negative talar tilt, Negative tib-fib stress, No instability. No pain with calf compression bilateral. Range of motion within normal limits with mild guarding on left medial ankle. Strength within normal limits in all groups bilateral.    Assessment and Plan: Problem List Items Addressed This Visit    None    Visit Diagnoses    Capsulitis of ankle, left    -  Primary   Relevant Medications   acetaminophen (TYLENOL) 500 MG tablet   diclofenac (VOLTAREN) 75 MG EC tablet   Left ankle pain, unspecified chronicity       Arthritis of ankle           -Complete examination performed -Previous MRI reviewed -Re-Discussed treatment options capsulitis with arthritis -After oral consent and aseptic prep, injected a mixture containing 1 ml of 2% plain lidocaine, 1 ml 0.5% plain marcaine, 0.5 ml of kenalog 10 and 0.5 ml of dexamethasone phosphate into left medial ankle without complication. Post-injection care discussed with patient.  -Advised patient to ice as needed -Prescribed diclofenac to see if this will give  him some additional relief for his arthritis pain -Continue with chronic pain medication of Oxy 10 which he takes for his chronic back issues like previous -Continue with good supportive shoes daily -Patient to return to office in 4 weeks for follow-up evaluation or sooner if condition worsens.  Advised patient if fails to continue to improve may benefit from physical therapy.  Landis Martins, DPM

## 2020-03-16 ENCOUNTER — Encounter: Payer: Self-pay | Admitting: Sports Medicine

## 2020-03-16 ENCOUNTER — Other Ambulatory Visit: Payer: Self-pay

## 2020-03-16 ENCOUNTER — Ambulatory Visit: Payer: Medicare Other | Admitting: Sports Medicine

## 2020-03-16 DIAGNOSIS — M19079 Primary osteoarthritis, unspecified ankle and foot: Secondary | ICD-10-CM | POA: Diagnosis not present

## 2020-03-16 DIAGNOSIS — M25572 Pain in left ankle and joints of left foot: Secondary | ICD-10-CM | POA: Diagnosis not present

## 2020-03-16 DIAGNOSIS — M7752 Other enthesopathy of left foot: Secondary | ICD-10-CM | POA: Diagnosis not present

## 2020-03-16 NOTE — Progress Notes (Signed)
Subjective:  Corey Webb is a 69 y.o. male patient who returns to office for follow up evaluation of left ankle pain.  Patient reports the injection did not help reports that his ankle stayed numb for about a day and the pain continues with sharp shooting pain at his ankle with dull aching pain.  No other pedal complaints noted.   Patient Active Problem List   Diagnosis Date Noted  . Smoking 07/10/2017  . Dyslipidemia 01/10/2017  . Essential hypertension 01/10/2017  . CAD in native artery 05/24/2016    Current Outpatient Medications on File Prior to Visit  Medication Sig Dispense Refill  . acetaminophen (TYLENOL) 500 MG tablet Take 500 mg by mouth every 6 (six) hours as needed.    Marland Kitchen albuterol (ACCUNEB) 0.63 MG/3ML nebulizer solution Take 1 ampule by nebulization every 6 (six) hours as needed for wheezing.    Marland Kitchen aspirin 325 MG tablet Take by mouth.    . diclofenac (VOLTAREN) 75 MG EC tablet Take 1 tablet (75 mg total) by mouth 2 (two) times daily. 30 tablet 0  . DULoxetine (CYMBALTA) 60 MG capsule TAKE 1 CAPSULE BY MOUTH DAILY    . Oxycodone HCl 10 MG TABS     . pravastatin (PRAVACHOL) 40 MG tablet TAKE 1 TABLET BY MOUTH AT BEDTIME FOR HIGH CHOLESTEROL    . verapamil (VERELAN PM) 180 MG 24 hr capsule Take by mouth.     Current Facility-Administered Medications on File Prior to Visit  Medication Dose Route Frequency Provider Last Rate Last Admin  . triamcinolone acetonide (KENALOG) 10 MG/ML injection 10 mg  10 mg Other Once Landis Martins, DPM        Allergies  Allergen Reactions  . Atorvastatin Other (See Comments)    Leg pain Leg pain   . Clonazepam Other (See Comments)  . Ropinirole Other (See Comments)  . Varenicline Other (See Comments)    Objective:  General: Alert and oriented x3 in no acute distress  Dermatology: No open lesions bilateral lower extremities, no webspace macerations, no ecchymosis bilateral, all nails x 10 are well manicured.  Vascular: Dorsalis  Pedis and Posterior Tibial pedal pulses palpable, varicosities bilateral.  Capillary Fill Time 5 seconds, scant pedal hair growth bilateral, no edema bilateral lower extremities, Temperature gradient within normal limits but subjectively cold feet like previous.  Neurology: Johney Maine sensation intact via light touch bilateral.  Musculoskeletal: Mild tenderness with palpation at left medial greater than lateral ankle similar to previous. Range of motion within normal limits with mild guarding on left medial ankle. Strength within normal limits in all groups bilateral.    Assessment and Plan: Problem List Items Addressed This Visit    None    Visit Diagnoses    Capsulitis of ankle, left    -  Primary   Left ankle pain, unspecified chronicity       Arthritis of ankle           -Complete examination performed -Re-Discussed continued care for capsulitis with arthritis -Dispensed ankle gauntlet for patient to use as instructed -Continue with chronic pain medication of Oxy 10 which he takes for his chronic back issues like previous -Advised patient if pain fails to improve may benefit from physical therapy and ankle -Continue with good supportive shoes daily -Patient to return to office as needed or sooner if problems or issues arise  Landis Martins, DPM

## 2020-09-19 DIAGNOSIS — M7989 Other specified soft tissue disorders: Secondary | ICD-10-CM | POA: Diagnosis not present

## 2020-09-19 DIAGNOSIS — M79604 Pain in right leg: Secondary | ICD-10-CM | POA: Diagnosis not present

## 2020-09-19 DIAGNOSIS — I739 Peripheral vascular disease, unspecified: Secondary | ICD-10-CM | POA: Diagnosis not present

## 2020-09-27 DIAGNOSIS — U071 COVID-19: Secondary | ICD-10-CM | POA: Diagnosis not present

## 2020-10-06 DIAGNOSIS — M545 Low back pain, unspecified: Secondary | ICD-10-CM | POA: Diagnosis not present

## 2020-10-12 DIAGNOSIS — Z79899 Other long term (current) drug therapy: Secondary | ICD-10-CM | POA: Diagnosis not present

## 2020-10-12 DIAGNOSIS — M48061 Spinal stenosis, lumbar region without neurogenic claudication: Secondary | ICD-10-CM | POA: Diagnosis not present

## 2020-10-12 DIAGNOSIS — Z6820 Body mass index (BMI) 20.0-20.9, adult: Secondary | ICD-10-CM | POA: Diagnosis not present

## 2020-10-12 DIAGNOSIS — R0989 Other specified symptoms and signs involving the circulatory and respiratory systems: Secondary | ICD-10-CM | POA: Diagnosis not present

## 2020-10-12 DIAGNOSIS — G8929 Other chronic pain: Secondary | ICD-10-CM | POA: Diagnosis not present

## 2020-10-12 DIAGNOSIS — M25572 Pain in left ankle and joints of left foot: Secondary | ICD-10-CM | POA: Diagnosis not present

## 2020-10-18 DIAGNOSIS — M19072 Primary osteoarthritis, left ankle and foot: Secondary | ICD-10-CM | POA: Diagnosis not present

## 2020-10-18 DIAGNOSIS — M76812 Anterior tibial syndrome, left leg: Secondary | ICD-10-CM | POA: Diagnosis not present

## 2020-10-28 DIAGNOSIS — U071 COVID-19: Secondary | ICD-10-CM | POA: Diagnosis not present

## 2020-11-01 DIAGNOSIS — J449 Chronic obstructive pulmonary disease, unspecified: Secondary | ICD-10-CM | POA: Diagnosis not present

## 2020-11-01 DIAGNOSIS — J9611 Chronic respiratory failure with hypoxia: Secondary | ICD-10-CM | POA: Diagnosis not present

## 2020-11-14 DIAGNOSIS — Z79899 Other long term (current) drug therapy: Secondary | ICD-10-CM | POA: Diagnosis not present

## 2020-11-14 DIAGNOSIS — J449 Chronic obstructive pulmonary disease, unspecified: Secondary | ICD-10-CM | POA: Diagnosis not present

## 2020-11-14 DIAGNOSIS — E7800 Pure hypercholesterolemia, unspecified: Secondary | ICD-10-CM | POA: Diagnosis not present

## 2020-11-14 DIAGNOSIS — Z681 Body mass index (BMI) 19 or less, adult: Secondary | ICD-10-CM | POA: Diagnosis not present

## 2020-11-27 DIAGNOSIS — U071 COVID-19: Secondary | ICD-10-CM | POA: Diagnosis not present

## 2020-12-01 DIAGNOSIS — J449 Chronic obstructive pulmonary disease, unspecified: Secondary | ICD-10-CM | POA: Diagnosis not present

## 2020-12-01 DIAGNOSIS — Z6820 Body mass index (BMI) 20.0-20.9, adult: Secondary | ICD-10-CM | POA: Diagnosis not present

## 2020-12-01 DIAGNOSIS — J9611 Chronic respiratory failure with hypoxia: Secondary | ICD-10-CM | POA: Diagnosis not present

## 2020-12-01 DIAGNOSIS — M5134 Other intervertebral disc degeneration, thoracic region: Secondary | ICD-10-CM | POA: Diagnosis not present

## 2020-12-14 DIAGNOSIS — Z1389 Encounter for screening for other disorder: Secondary | ICD-10-CM | POA: Diagnosis not present

## 2020-12-14 DIAGNOSIS — M5136 Other intervertebral disc degeneration, lumbar region: Secondary | ICD-10-CM | POA: Diagnosis not present

## 2020-12-14 DIAGNOSIS — G894 Chronic pain syndrome: Secondary | ICD-10-CM | POA: Diagnosis not present

## 2020-12-14 DIAGNOSIS — M549 Dorsalgia, unspecified: Secondary | ICD-10-CM | POA: Diagnosis not present

## 2020-12-28 DIAGNOSIS — M47816 Spondylosis without myelopathy or radiculopathy, lumbar region: Secondary | ICD-10-CM | POA: Diagnosis not present

## 2020-12-28 DIAGNOSIS — Z1389 Encounter for screening for other disorder: Secondary | ICD-10-CM | POA: Diagnosis not present

## 2020-12-28 DIAGNOSIS — G894 Chronic pain syndrome: Secondary | ICD-10-CM | POA: Diagnosis not present

## 2020-12-28 DIAGNOSIS — U071 COVID-19: Secondary | ICD-10-CM | POA: Diagnosis not present

## 2020-12-28 DIAGNOSIS — M4302 Spondylolysis, cervical region: Secondary | ICD-10-CM | POA: Diagnosis not present

## 2020-12-28 DIAGNOSIS — M5136 Other intervertebral disc degeneration, lumbar region: Secondary | ICD-10-CM | POA: Diagnosis not present

## 2020-12-28 DIAGNOSIS — M549 Dorsalgia, unspecified: Secondary | ICD-10-CM | POA: Diagnosis not present

## 2021-01-01 DIAGNOSIS — J449 Chronic obstructive pulmonary disease, unspecified: Secondary | ICD-10-CM | POA: Diagnosis not present

## 2021-01-01 DIAGNOSIS — J9611 Chronic respiratory failure with hypoxia: Secondary | ICD-10-CM | POA: Diagnosis not present

## 2021-01-26 DIAGNOSIS — Z1389 Encounter for screening for other disorder: Secondary | ICD-10-CM | POA: Diagnosis not present

## 2021-01-26 DIAGNOSIS — M47816 Spondylosis without myelopathy or radiculopathy, lumbar region: Secondary | ICD-10-CM | POA: Diagnosis not present

## 2021-01-26 DIAGNOSIS — G894 Chronic pain syndrome: Secondary | ICD-10-CM | POA: Diagnosis not present

## 2021-01-26 DIAGNOSIS — M549 Dorsalgia, unspecified: Secondary | ICD-10-CM | POA: Diagnosis not present

## 2021-01-26 DIAGNOSIS — M5136 Other intervertebral disc degeneration, lumbar region: Secondary | ICD-10-CM | POA: Diagnosis not present

## 2021-01-26 DIAGNOSIS — M4302 Spondylolysis, cervical region: Secondary | ICD-10-CM | POA: Diagnosis not present

## 2021-01-28 DIAGNOSIS — U071 COVID-19: Secondary | ICD-10-CM | POA: Diagnosis not present

## 2021-02-01 DIAGNOSIS — J9611 Chronic respiratory failure with hypoxia: Secondary | ICD-10-CM | POA: Diagnosis not present

## 2021-02-01 DIAGNOSIS — J449 Chronic obstructive pulmonary disease, unspecified: Secondary | ICD-10-CM | POA: Diagnosis not present

## 2021-02-23 DIAGNOSIS — Z1389 Encounter for screening for other disorder: Secondary | ICD-10-CM | POA: Diagnosis not present

## 2021-02-23 DIAGNOSIS — M4302 Spondylolysis, cervical region: Secondary | ICD-10-CM | POA: Diagnosis not present

## 2021-02-23 DIAGNOSIS — Z79891 Long term (current) use of opiate analgesic: Secondary | ICD-10-CM | POA: Diagnosis not present

## 2021-02-23 DIAGNOSIS — M549 Dorsalgia, unspecified: Secondary | ICD-10-CM | POA: Diagnosis not present

## 2021-02-23 DIAGNOSIS — M47816 Spondylosis without myelopathy or radiculopathy, lumbar region: Secondary | ICD-10-CM | POA: Diagnosis not present

## 2021-02-23 DIAGNOSIS — G894 Chronic pain syndrome: Secondary | ICD-10-CM | POA: Diagnosis not present

## 2021-02-23 DIAGNOSIS — M5136 Other intervertebral disc degeneration, lumbar region: Secondary | ICD-10-CM | POA: Diagnosis not present

## 2021-02-27 DIAGNOSIS — U071 COVID-19: Secondary | ICD-10-CM | POA: Diagnosis not present

## 2021-03-03 DIAGNOSIS — J9611 Chronic respiratory failure with hypoxia: Secondary | ICD-10-CM | POA: Diagnosis not present

## 2021-03-03 DIAGNOSIS — J449 Chronic obstructive pulmonary disease, unspecified: Secondary | ICD-10-CM | POA: Diagnosis not present

## 2021-03-28 DIAGNOSIS — G894 Chronic pain syndrome: Secondary | ICD-10-CM | POA: Diagnosis not present

## 2021-03-28 DIAGNOSIS — M4302 Spondylolysis, cervical region: Secondary | ICD-10-CM | POA: Diagnosis not present

## 2021-03-28 DIAGNOSIS — M549 Dorsalgia, unspecified: Secondary | ICD-10-CM | POA: Diagnosis not present

## 2021-03-28 DIAGNOSIS — M5136 Other intervertebral disc degeneration, lumbar region: Secondary | ICD-10-CM | POA: Diagnosis not present

## 2021-03-28 DIAGNOSIS — Z79891 Long term (current) use of opiate analgesic: Secondary | ICD-10-CM | POA: Diagnosis not present

## 2021-03-28 DIAGNOSIS — M47816 Spondylosis without myelopathy or radiculopathy, lumbar region: Secondary | ICD-10-CM | POA: Diagnosis not present

## 2021-03-28 DIAGNOSIS — Z1389 Encounter for screening for other disorder: Secondary | ICD-10-CM | POA: Diagnosis not present

## 2021-03-30 DIAGNOSIS — U071 COVID-19: Secondary | ICD-10-CM | POA: Diagnosis not present

## 2021-04-03 DIAGNOSIS — J449 Chronic obstructive pulmonary disease, unspecified: Secondary | ICD-10-CM | POA: Diagnosis not present

## 2021-04-03 DIAGNOSIS — J9611 Chronic respiratory failure with hypoxia: Secondary | ICD-10-CM | POA: Diagnosis not present

## 2021-04-21 DIAGNOSIS — R0602 Shortness of breath: Secondary | ICD-10-CM | POA: Diagnosis not present

## 2021-04-21 DIAGNOSIS — F17210 Nicotine dependence, cigarettes, uncomplicated: Secondary | ICD-10-CM | POA: Diagnosis not present

## 2021-04-21 DIAGNOSIS — J101 Influenza due to other identified influenza virus with other respiratory manifestations: Secondary | ICD-10-CM | POA: Diagnosis not present

## 2021-04-21 DIAGNOSIS — G8929 Other chronic pain: Secondary | ICD-10-CM | POA: Diagnosis not present

## 2021-04-21 DIAGNOSIS — Z9981 Dependence on supplemental oxygen: Secondary | ICD-10-CM | POA: Diagnosis not present

## 2021-04-21 DIAGNOSIS — M810 Age-related osteoporosis without current pathological fracture: Secondary | ICD-10-CM | POA: Diagnosis not present

## 2021-04-21 DIAGNOSIS — E785 Hyperlipidemia, unspecified: Secondary | ICD-10-CM | POA: Diagnosis not present

## 2021-04-21 DIAGNOSIS — R0902 Hypoxemia: Secondary | ICD-10-CM | POA: Diagnosis not present

## 2021-04-21 DIAGNOSIS — J441 Chronic obstructive pulmonary disease with (acute) exacerbation: Secondary | ICD-10-CM | POA: Diagnosis not present

## 2021-04-21 DIAGNOSIS — I2510 Atherosclerotic heart disease of native coronary artery without angina pectoris: Secondary | ICD-10-CM | POA: Diagnosis not present

## 2021-04-21 DIAGNOSIS — Z7982 Long term (current) use of aspirin: Secondary | ICD-10-CM | POA: Diagnosis not present

## 2021-04-21 DIAGNOSIS — M549 Dorsalgia, unspecified: Secondary | ICD-10-CM | POA: Diagnosis not present

## 2021-04-21 DIAGNOSIS — R079 Chest pain, unspecified: Secondary | ICD-10-CM | POA: Diagnosis not present

## 2021-04-21 DIAGNOSIS — Z20822 Contact with and (suspected) exposure to covid-19: Secondary | ICD-10-CM | POA: Diagnosis not present

## 2021-04-21 DIAGNOSIS — R110 Nausea: Secondary | ICD-10-CM | POA: Diagnosis not present

## 2021-04-27 DIAGNOSIS — J101 Influenza due to other identified influenza virus with other respiratory manifestations: Secondary | ICD-10-CM | POA: Diagnosis not present

## 2021-04-27 DIAGNOSIS — Z1389 Encounter for screening for other disorder: Secondary | ICD-10-CM | POA: Diagnosis not present

## 2021-04-27 DIAGNOSIS — Z79891 Long term (current) use of opiate analgesic: Secondary | ICD-10-CM | POA: Diagnosis not present

## 2021-04-27 DIAGNOSIS — G894 Chronic pain syndrome: Secondary | ICD-10-CM | POA: Diagnosis not present

## 2021-04-27 DIAGNOSIS — M549 Dorsalgia, unspecified: Secondary | ICD-10-CM | POA: Diagnosis not present

## 2021-04-27 DIAGNOSIS — M47816 Spondylosis without myelopathy or radiculopathy, lumbar region: Secondary | ICD-10-CM | POA: Diagnosis not present

## 2021-04-27 DIAGNOSIS — M4302 Spondylolysis, cervical region: Secondary | ICD-10-CM | POA: Diagnosis not present

## 2021-04-27 DIAGNOSIS — M5136 Other intervertebral disc degeneration, lumbar region: Secondary | ICD-10-CM | POA: Diagnosis not present

## 2021-05-03 DIAGNOSIS — J449 Chronic obstructive pulmonary disease, unspecified: Secondary | ICD-10-CM | POA: Diagnosis not present

## 2021-05-03 DIAGNOSIS — J9611 Chronic respiratory failure with hypoxia: Secondary | ICD-10-CM | POA: Diagnosis not present

## 2021-05-04 DIAGNOSIS — R972 Elevated prostate specific antigen [PSA]: Secondary | ICD-10-CM | POA: Diagnosis not present

## 2021-05-04 DIAGNOSIS — Z139 Encounter for screening, unspecified: Secondary | ICD-10-CM | POA: Diagnosis not present

## 2021-05-04 DIAGNOSIS — F1120 Opioid dependence, uncomplicated: Secondary | ICD-10-CM | POA: Diagnosis not present

## 2021-05-04 DIAGNOSIS — Z9181 History of falling: Secondary | ICD-10-CM | POA: Diagnosis not present

## 2021-05-04 DIAGNOSIS — Z79899 Other long term (current) drug therapy: Secondary | ICD-10-CM | POA: Diagnosis not present

## 2021-05-04 DIAGNOSIS — J449 Chronic obstructive pulmonary disease, unspecified: Secondary | ICD-10-CM | POA: Diagnosis not present

## 2021-05-04 DIAGNOSIS — E7800 Pure hypercholesterolemia, unspecified: Secondary | ICD-10-CM | POA: Diagnosis not present

## 2021-05-04 DIAGNOSIS — Z23 Encounter for immunization: Secondary | ICD-10-CM | POA: Diagnosis not present

## 2021-05-04 DIAGNOSIS — Z125 Encounter for screening for malignant neoplasm of prostate: Secondary | ICD-10-CM | POA: Diagnosis not present

## 2021-05-18 DIAGNOSIS — Z9181 History of falling: Secondary | ICD-10-CM | POA: Diagnosis not present

## 2021-05-18 DIAGNOSIS — E785 Hyperlipidemia, unspecified: Secondary | ICD-10-CM | POA: Diagnosis not present

## 2021-05-18 DIAGNOSIS — Z0000 Encounter for general adult medical examination without abnormal findings: Secondary | ICD-10-CM | POA: Diagnosis not present

## 2021-05-18 DIAGNOSIS — Z1331 Encounter for screening for depression: Secondary | ICD-10-CM | POA: Diagnosis not present

## 2021-05-23 DIAGNOSIS — Z87891 Personal history of nicotine dependence: Secondary | ICD-10-CM | POA: Diagnosis not present

## 2021-05-23 DIAGNOSIS — F17210 Nicotine dependence, cigarettes, uncomplicated: Secondary | ICD-10-CM | POA: Diagnosis not present

## 2021-05-29 DIAGNOSIS — M47816 Spondylosis without myelopathy or radiculopathy, lumbar region: Secondary | ICD-10-CM | POA: Diagnosis not present

## 2021-05-29 DIAGNOSIS — M549 Dorsalgia, unspecified: Secondary | ICD-10-CM | POA: Diagnosis not present

## 2021-05-29 DIAGNOSIS — G894 Chronic pain syndrome: Secondary | ICD-10-CM | POA: Diagnosis not present

## 2021-05-29 DIAGNOSIS — Z1389 Encounter for screening for other disorder: Secondary | ICD-10-CM | POA: Diagnosis not present

## 2021-05-29 DIAGNOSIS — M5136 Other intervertebral disc degeneration, lumbar region: Secondary | ICD-10-CM | POA: Diagnosis not present

## 2021-05-29 DIAGNOSIS — Z79891 Long term (current) use of opiate analgesic: Secondary | ICD-10-CM | POA: Diagnosis not present

## 2021-05-29 DIAGNOSIS — M4302 Spondylolysis, cervical region: Secondary | ICD-10-CM | POA: Diagnosis not present

## 2021-05-30 DIAGNOSIS — R351 Nocturia: Secondary | ICD-10-CM | POA: Diagnosis not present

## 2021-05-30 DIAGNOSIS — R972 Elevated prostate specific antigen [PSA]: Secondary | ICD-10-CM | POA: Diagnosis not present

## 2021-05-30 DIAGNOSIS — N401 Enlarged prostate with lower urinary tract symptoms: Secondary | ICD-10-CM | POA: Diagnosis not present

## 2021-06-03 DIAGNOSIS — J9611 Chronic respiratory failure with hypoxia: Secondary | ICD-10-CM | POA: Diagnosis not present

## 2021-06-03 DIAGNOSIS — J449 Chronic obstructive pulmonary disease, unspecified: Secondary | ICD-10-CM | POA: Diagnosis not present

## 2021-06-27 DIAGNOSIS — M5136 Other intervertebral disc degeneration, lumbar region: Secondary | ICD-10-CM | POA: Diagnosis not present

## 2021-06-27 DIAGNOSIS — Z79891 Long term (current) use of opiate analgesic: Secondary | ICD-10-CM | POA: Diagnosis not present

## 2021-06-27 DIAGNOSIS — Z1389 Encounter for screening for other disorder: Secondary | ICD-10-CM | POA: Diagnosis not present

## 2021-06-27 DIAGNOSIS — M47816 Spondylosis without myelopathy or radiculopathy, lumbar region: Secondary | ICD-10-CM | POA: Diagnosis not present

## 2021-06-27 DIAGNOSIS — M4302 Spondylolysis, cervical region: Secondary | ICD-10-CM | POA: Diagnosis not present

## 2021-06-27 DIAGNOSIS — N401 Enlarged prostate with lower urinary tract symptoms: Secondary | ICD-10-CM | POA: Diagnosis not present

## 2021-06-27 DIAGNOSIS — M549 Dorsalgia, unspecified: Secondary | ICD-10-CM | POA: Diagnosis not present

## 2021-06-27 DIAGNOSIS — R972 Elevated prostate specific antigen [PSA]: Secondary | ICD-10-CM | POA: Diagnosis not present

## 2021-06-27 DIAGNOSIS — R351 Nocturia: Secondary | ICD-10-CM | POA: Diagnosis not present

## 2021-06-27 DIAGNOSIS — G894 Chronic pain syndrome: Secondary | ICD-10-CM | POA: Diagnosis not present

## 2021-07-04 DIAGNOSIS — J449 Chronic obstructive pulmonary disease, unspecified: Secondary | ICD-10-CM | POA: Diagnosis not present

## 2021-07-04 DIAGNOSIS — J9611 Chronic respiratory failure with hypoxia: Secondary | ICD-10-CM | POA: Diagnosis not present

## 2021-07-17 DIAGNOSIS — N401 Enlarged prostate with lower urinary tract symptoms: Secondary | ICD-10-CM | POA: Diagnosis not present

## 2021-07-17 DIAGNOSIS — R351 Nocturia: Secondary | ICD-10-CM | POA: Diagnosis not present

## 2021-07-17 DIAGNOSIS — R972 Elevated prostate specific antigen [PSA]: Secondary | ICD-10-CM | POA: Diagnosis not present

## 2021-07-18 DIAGNOSIS — C61 Malignant neoplasm of prostate: Secondary | ICD-10-CM | POA: Diagnosis not present

## 2021-07-24 DIAGNOSIS — C61 Malignant neoplasm of prostate: Secondary | ICD-10-CM | POA: Diagnosis not present

## 2021-07-24 DIAGNOSIS — R351 Nocturia: Secondary | ICD-10-CM | POA: Diagnosis not present

## 2021-07-24 DIAGNOSIS — R972 Elevated prostate specific antigen [PSA]: Secondary | ICD-10-CM | POA: Diagnosis not present

## 2021-07-25 DIAGNOSIS — M4302 Spondylolysis, cervical region: Secondary | ICD-10-CM | POA: Diagnosis not present

## 2021-07-25 DIAGNOSIS — Z1389 Encounter for screening for other disorder: Secondary | ICD-10-CM | POA: Diagnosis not present

## 2021-07-25 DIAGNOSIS — Z79891 Long term (current) use of opiate analgesic: Secondary | ICD-10-CM | POA: Diagnosis not present

## 2021-07-25 DIAGNOSIS — G894 Chronic pain syndrome: Secondary | ICD-10-CM | POA: Diagnosis not present

## 2021-07-25 DIAGNOSIS — M5136 Other intervertebral disc degeneration, lumbar region: Secondary | ICD-10-CM | POA: Diagnosis not present

## 2021-07-25 DIAGNOSIS — M549 Dorsalgia, unspecified: Secondary | ICD-10-CM | POA: Diagnosis not present

## 2021-07-25 DIAGNOSIS — M47816 Spondylosis without myelopathy or radiculopathy, lumbar region: Secondary | ICD-10-CM | POA: Diagnosis not present

## 2021-07-28 DIAGNOSIS — M549 Dorsalgia, unspecified: Secondary | ICD-10-CM | POA: Diagnosis not present

## 2021-07-28 DIAGNOSIS — G894 Chronic pain syndrome: Secondary | ICD-10-CM | POA: Diagnosis not present

## 2021-07-28 DIAGNOSIS — M4302 Spondylolysis, cervical region: Secondary | ICD-10-CM | POA: Diagnosis not present

## 2021-07-28 DIAGNOSIS — Z1389 Encounter for screening for other disorder: Secondary | ICD-10-CM | POA: Diagnosis not present

## 2021-07-28 DIAGNOSIS — M47816 Spondylosis without myelopathy or radiculopathy, lumbar region: Secondary | ICD-10-CM | POA: Diagnosis not present

## 2021-07-28 DIAGNOSIS — M5136 Other intervertebral disc degeneration, lumbar region: Secondary | ICD-10-CM | POA: Diagnosis not present

## 2021-07-28 DIAGNOSIS — Z79891 Long term (current) use of opiate analgesic: Secondary | ICD-10-CM | POA: Diagnosis not present

## 2021-08-01 DIAGNOSIS — J9611 Chronic respiratory failure with hypoxia: Secondary | ICD-10-CM | POA: Diagnosis not present

## 2021-08-01 DIAGNOSIS — J449 Chronic obstructive pulmonary disease, unspecified: Secondary | ICD-10-CM | POA: Diagnosis not present

## 2021-08-03 DIAGNOSIS — K5900 Constipation, unspecified: Secondary | ICD-10-CM | POA: Diagnosis not present

## 2021-08-03 DIAGNOSIS — K8689 Other specified diseases of pancreas: Secondary | ICD-10-CM | POA: Diagnosis not present

## 2021-08-03 DIAGNOSIS — C61 Malignant neoplasm of prostate: Secondary | ICD-10-CM | POA: Diagnosis not present

## 2021-08-09 DIAGNOSIS — R972 Elevated prostate specific antigen [PSA]: Secondary | ICD-10-CM | POA: Diagnosis not present

## 2021-08-09 DIAGNOSIS — C61 Malignant neoplasm of prostate: Secondary | ICD-10-CM | POA: Diagnosis not present

## 2021-08-09 DIAGNOSIS — R351 Nocturia: Secondary | ICD-10-CM | POA: Diagnosis not present

## 2021-08-14 DIAGNOSIS — C61 Malignant neoplasm of prostate: Secondary | ICD-10-CM | POA: Diagnosis not present

## 2021-08-16 DIAGNOSIS — C61 Malignant neoplasm of prostate: Secondary | ICD-10-CM | POA: Diagnosis not present

## 2021-08-16 DIAGNOSIS — Z510 Encounter for antineoplastic radiation therapy: Secondary | ICD-10-CM | POA: Diagnosis not present

## 2021-08-16 DIAGNOSIS — Z191 Hormone sensitive malignancy status: Secondary | ICD-10-CM | POA: Diagnosis not present

## 2021-08-22 DIAGNOSIS — M5136 Other intervertebral disc degeneration, lumbar region: Secondary | ICD-10-CM | POA: Diagnosis not present

## 2021-08-22 DIAGNOSIS — M47816 Spondylosis without myelopathy or radiculopathy, lumbar region: Secondary | ICD-10-CM | POA: Diagnosis not present

## 2021-08-22 DIAGNOSIS — G894 Chronic pain syndrome: Secondary | ICD-10-CM | POA: Diagnosis not present

## 2021-08-22 DIAGNOSIS — M4302 Spondylolysis, cervical region: Secondary | ICD-10-CM | POA: Diagnosis not present

## 2021-08-22 DIAGNOSIS — Z79891 Long term (current) use of opiate analgesic: Secondary | ICD-10-CM | POA: Diagnosis not present

## 2021-08-22 DIAGNOSIS — C61 Malignant neoplasm of prostate: Secondary | ICD-10-CM | POA: Diagnosis not present

## 2021-08-22 DIAGNOSIS — M549 Dorsalgia, unspecified: Secondary | ICD-10-CM | POA: Diagnosis not present

## 2021-08-22 DIAGNOSIS — Z510 Encounter for antineoplastic radiation therapy: Secondary | ICD-10-CM | POA: Diagnosis not present

## 2021-08-22 DIAGNOSIS — Z1389 Encounter for screening for other disorder: Secondary | ICD-10-CM | POA: Diagnosis not present

## 2021-08-23 DIAGNOSIS — C61 Malignant neoplasm of prostate: Secondary | ICD-10-CM | POA: Diagnosis not present

## 2021-08-23 DIAGNOSIS — Z510 Encounter for antineoplastic radiation therapy: Secondary | ICD-10-CM | POA: Diagnosis not present

## 2021-08-23 DIAGNOSIS — Z191 Hormone sensitive malignancy status: Secondary | ICD-10-CM | POA: Diagnosis not present

## 2021-08-24 DIAGNOSIS — C61 Malignant neoplasm of prostate: Secondary | ICD-10-CM | POA: Diagnosis not present

## 2021-08-24 DIAGNOSIS — Z510 Encounter for antineoplastic radiation therapy: Secondary | ICD-10-CM | POA: Diagnosis not present

## 2021-08-24 DIAGNOSIS — Z191 Hormone sensitive malignancy status: Secondary | ICD-10-CM | POA: Diagnosis not present

## 2021-08-25 DIAGNOSIS — Z191 Hormone sensitive malignancy status: Secondary | ICD-10-CM | POA: Diagnosis not present

## 2021-08-25 DIAGNOSIS — C61 Malignant neoplasm of prostate: Secondary | ICD-10-CM | POA: Diagnosis not present

## 2021-08-25 DIAGNOSIS — Z510 Encounter for antineoplastic radiation therapy: Secondary | ICD-10-CM | POA: Diagnosis not present

## 2021-08-25 NOTE — Progress Notes (Signed)
Patient is getting radiation daily, enrolled him into the [employer] to help with the cost of gas. ?

## 2021-08-28 DIAGNOSIS — Z191 Hormone sensitive malignancy status: Secondary | ICD-10-CM | POA: Diagnosis not present

## 2021-08-28 DIAGNOSIS — C61 Malignant neoplasm of prostate: Secondary | ICD-10-CM | POA: Diagnosis not present

## 2021-08-28 DIAGNOSIS — Z510 Encounter for antineoplastic radiation therapy: Secondary | ICD-10-CM | POA: Diagnosis not present

## 2021-08-29 DIAGNOSIS — Z191 Hormone sensitive malignancy status: Secondary | ICD-10-CM | POA: Diagnosis not present

## 2021-08-29 DIAGNOSIS — C61 Malignant neoplasm of prostate: Secondary | ICD-10-CM | POA: Diagnosis not present

## 2021-08-29 DIAGNOSIS — Z510 Encounter for antineoplastic radiation therapy: Secondary | ICD-10-CM | POA: Diagnosis not present

## 2021-08-30 DIAGNOSIS — Z510 Encounter for antineoplastic radiation therapy: Secondary | ICD-10-CM | POA: Diagnosis not present

## 2021-08-30 DIAGNOSIS — Z191 Hormone sensitive malignancy status: Secondary | ICD-10-CM | POA: Diagnosis not present

## 2021-08-30 DIAGNOSIS — C61 Malignant neoplasm of prostate: Secondary | ICD-10-CM | POA: Diagnosis not present

## 2021-08-31 DIAGNOSIS — R351 Nocturia: Secondary | ICD-10-CM | POA: Diagnosis not present

## 2021-08-31 DIAGNOSIS — Z191 Hormone sensitive malignancy status: Secondary | ICD-10-CM | POA: Diagnosis not present

## 2021-08-31 DIAGNOSIS — R972 Elevated prostate specific antigen [PSA]: Secondary | ICD-10-CM | POA: Diagnosis not present

## 2021-08-31 DIAGNOSIS — Z510 Encounter for antineoplastic radiation therapy: Secondary | ICD-10-CM | POA: Diagnosis not present

## 2021-08-31 DIAGNOSIS — C61 Malignant neoplasm of prostate: Secondary | ICD-10-CM | POA: Diagnosis not present

## 2021-09-01 DIAGNOSIS — J449 Chronic obstructive pulmonary disease, unspecified: Secondary | ICD-10-CM | POA: Diagnosis not present

## 2021-09-01 DIAGNOSIS — J9611 Chronic respiratory failure with hypoxia: Secondary | ICD-10-CM | POA: Diagnosis not present

## 2021-09-01 DIAGNOSIS — C61 Malignant neoplasm of prostate: Secondary | ICD-10-CM | POA: Diagnosis not present

## 2021-09-01 DIAGNOSIS — Z191 Hormone sensitive malignancy status: Secondary | ICD-10-CM | POA: Diagnosis not present

## 2021-09-01 DIAGNOSIS — Z510 Encounter for antineoplastic radiation therapy: Secondary | ICD-10-CM | POA: Diagnosis not present

## 2021-09-04 DIAGNOSIS — Z191 Hormone sensitive malignancy status: Secondary | ICD-10-CM | POA: Diagnosis not present

## 2021-09-04 DIAGNOSIS — C61 Malignant neoplasm of prostate: Secondary | ICD-10-CM | POA: Diagnosis not present

## 2021-09-04 DIAGNOSIS — Z510 Encounter for antineoplastic radiation therapy: Secondary | ICD-10-CM | POA: Diagnosis not present

## 2021-09-05 DIAGNOSIS — Z191 Hormone sensitive malignancy status: Secondary | ICD-10-CM | POA: Diagnosis not present

## 2021-09-05 DIAGNOSIS — Z510 Encounter for antineoplastic radiation therapy: Secondary | ICD-10-CM | POA: Diagnosis not present

## 2021-09-05 DIAGNOSIS — C61 Malignant neoplasm of prostate: Secondary | ICD-10-CM | POA: Diagnosis not present

## 2021-09-06 DIAGNOSIS — C61 Malignant neoplasm of prostate: Secondary | ICD-10-CM | POA: Diagnosis not present

## 2021-09-06 DIAGNOSIS — Z510 Encounter for antineoplastic radiation therapy: Secondary | ICD-10-CM | POA: Diagnosis not present

## 2021-09-06 DIAGNOSIS — Z191 Hormone sensitive malignancy status: Secondary | ICD-10-CM | POA: Diagnosis not present

## 2021-09-07 DIAGNOSIS — C61 Malignant neoplasm of prostate: Secondary | ICD-10-CM | POA: Diagnosis not present

## 2021-09-07 DIAGNOSIS — Z191 Hormone sensitive malignancy status: Secondary | ICD-10-CM | POA: Diagnosis not present

## 2021-09-07 DIAGNOSIS — Z510 Encounter for antineoplastic radiation therapy: Secondary | ICD-10-CM | POA: Diagnosis not present

## 2021-09-08 DIAGNOSIS — C61 Malignant neoplasm of prostate: Secondary | ICD-10-CM | POA: Diagnosis not present

## 2021-09-08 DIAGNOSIS — Z510 Encounter for antineoplastic radiation therapy: Secondary | ICD-10-CM | POA: Diagnosis not present

## 2021-09-08 DIAGNOSIS — Z191 Hormone sensitive malignancy status: Secondary | ICD-10-CM | POA: Diagnosis not present

## 2021-09-11 DIAGNOSIS — C61 Malignant neoplasm of prostate: Secondary | ICD-10-CM | POA: Diagnosis not present

## 2021-09-11 DIAGNOSIS — Z510 Encounter for antineoplastic radiation therapy: Secondary | ICD-10-CM | POA: Diagnosis not present

## 2021-09-11 DIAGNOSIS — Z191 Hormone sensitive malignancy status: Secondary | ICD-10-CM | POA: Diagnosis not present

## 2021-09-12 DIAGNOSIS — Z191 Hormone sensitive malignancy status: Secondary | ICD-10-CM | POA: Diagnosis not present

## 2021-09-12 DIAGNOSIS — C61 Malignant neoplasm of prostate: Secondary | ICD-10-CM | POA: Diagnosis not present

## 2021-09-12 DIAGNOSIS — Z510 Encounter for antineoplastic radiation therapy: Secondary | ICD-10-CM | POA: Diagnosis not present

## 2021-09-13 DIAGNOSIS — C61 Malignant neoplasm of prostate: Secondary | ICD-10-CM | POA: Diagnosis not present

## 2021-09-13 DIAGNOSIS — Z510 Encounter for antineoplastic radiation therapy: Secondary | ICD-10-CM | POA: Diagnosis not present

## 2021-09-13 DIAGNOSIS — Z191 Hormone sensitive malignancy status: Secondary | ICD-10-CM | POA: Diagnosis not present

## 2021-09-14 DIAGNOSIS — Z191 Hormone sensitive malignancy status: Secondary | ICD-10-CM | POA: Diagnosis not present

## 2021-09-14 DIAGNOSIS — C61 Malignant neoplasm of prostate: Secondary | ICD-10-CM | POA: Diagnosis not present

## 2021-09-14 DIAGNOSIS — Z510 Encounter for antineoplastic radiation therapy: Secondary | ICD-10-CM | POA: Diagnosis not present

## 2021-09-15 DIAGNOSIS — Z510 Encounter for antineoplastic radiation therapy: Secondary | ICD-10-CM | POA: Diagnosis not present

## 2021-09-15 DIAGNOSIS — Z191 Hormone sensitive malignancy status: Secondary | ICD-10-CM | POA: Diagnosis not present

## 2021-09-15 DIAGNOSIS — C61 Malignant neoplasm of prostate: Secondary | ICD-10-CM | POA: Diagnosis not present

## 2021-09-18 DIAGNOSIS — Z191 Hormone sensitive malignancy status: Secondary | ICD-10-CM | POA: Diagnosis not present

## 2021-09-18 DIAGNOSIS — Z510 Encounter for antineoplastic radiation therapy: Secondary | ICD-10-CM | POA: Diagnosis not present

## 2021-09-18 DIAGNOSIS — C61 Malignant neoplasm of prostate: Secondary | ICD-10-CM | POA: Diagnosis not present

## 2021-09-19 DIAGNOSIS — M47816 Spondylosis without myelopathy or radiculopathy, lumbar region: Secondary | ICD-10-CM | POA: Diagnosis not present

## 2021-09-19 DIAGNOSIS — G894 Chronic pain syndrome: Secondary | ICD-10-CM | POA: Diagnosis not present

## 2021-09-19 DIAGNOSIS — M4302 Spondylolysis, cervical region: Secondary | ICD-10-CM | POA: Diagnosis not present

## 2021-09-19 DIAGNOSIS — Z79891 Long term (current) use of opiate analgesic: Secondary | ICD-10-CM | POA: Diagnosis not present

## 2021-09-19 DIAGNOSIS — M549 Dorsalgia, unspecified: Secondary | ICD-10-CM | POA: Diagnosis not present

## 2021-09-19 DIAGNOSIS — M5136 Other intervertebral disc degeneration, lumbar region: Secondary | ICD-10-CM | POA: Diagnosis not present

## 2021-09-19 DIAGNOSIS — Z1389 Encounter for screening for other disorder: Secondary | ICD-10-CM | POA: Diagnosis not present

## 2021-09-19 DIAGNOSIS — Z191 Hormone sensitive malignancy status: Secondary | ICD-10-CM | POA: Diagnosis not present

## 2021-09-19 DIAGNOSIS — Z510 Encounter for antineoplastic radiation therapy: Secondary | ICD-10-CM | POA: Diagnosis not present

## 2021-09-19 DIAGNOSIS — C61 Malignant neoplasm of prostate: Secondary | ICD-10-CM | POA: Diagnosis not present

## 2021-09-20 DIAGNOSIS — Z510 Encounter for antineoplastic radiation therapy: Secondary | ICD-10-CM | POA: Diagnosis not present

## 2021-09-20 DIAGNOSIS — Z191 Hormone sensitive malignancy status: Secondary | ICD-10-CM | POA: Diagnosis not present

## 2021-09-20 DIAGNOSIS — C61 Malignant neoplasm of prostate: Secondary | ICD-10-CM | POA: Diagnosis not present

## 2021-09-21 DIAGNOSIS — Z191 Hormone sensitive malignancy status: Secondary | ICD-10-CM | POA: Diagnosis not present

## 2021-09-21 DIAGNOSIS — C61 Malignant neoplasm of prostate: Secondary | ICD-10-CM | POA: Diagnosis not present

## 2021-09-21 DIAGNOSIS — Z510 Encounter for antineoplastic radiation therapy: Secondary | ICD-10-CM | POA: Diagnosis not present

## 2021-09-22 DIAGNOSIS — C61 Malignant neoplasm of prostate: Secondary | ICD-10-CM | POA: Diagnosis not present

## 2021-09-22 DIAGNOSIS — Z191 Hormone sensitive malignancy status: Secondary | ICD-10-CM | POA: Diagnosis not present

## 2021-09-22 DIAGNOSIS — Z510 Encounter for antineoplastic radiation therapy: Secondary | ICD-10-CM | POA: Diagnosis not present

## 2021-09-25 DIAGNOSIS — Z510 Encounter for antineoplastic radiation therapy: Secondary | ICD-10-CM | POA: Diagnosis not present

## 2021-09-25 DIAGNOSIS — Z191 Hormone sensitive malignancy status: Secondary | ICD-10-CM | POA: Diagnosis not present

## 2021-09-25 DIAGNOSIS — C61 Malignant neoplasm of prostate: Secondary | ICD-10-CM | POA: Diagnosis not present

## 2021-09-26 DIAGNOSIS — Z510 Encounter for antineoplastic radiation therapy: Secondary | ICD-10-CM | POA: Diagnosis not present

## 2021-09-26 DIAGNOSIS — Z191 Hormone sensitive malignancy status: Secondary | ICD-10-CM | POA: Diagnosis not present

## 2021-09-26 DIAGNOSIS — C61 Malignant neoplasm of prostate: Secondary | ICD-10-CM | POA: Diagnosis not present

## 2021-09-27 DIAGNOSIS — Z510 Encounter for antineoplastic radiation therapy: Secondary | ICD-10-CM | POA: Diagnosis not present

## 2021-09-27 DIAGNOSIS — Z191 Hormone sensitive malignancy status: Secondary | ICD-10-CM | POA: Diagnosis not present

## 2021-09-27 DIAGNOSIS — C61 Malignant neoplasm of prostate: Secondary | ICD-10-CM | POA: Diagnosis not present

## 2021-09-28 DIAGNOSIS — Z510 Encounter for antineoplastic radiation therapy: Secondary | ICD-10-CM | POA: Diagnosis not present

## 2021-09-28 DIAGNOSIS — C61 Malignant neoplasm of prostate: Secondary | ICD-10-CM | POA: Diagnosis not present

## 2021-09-28 DIAGNOSIS — Z191 Hormone sensitive malignancy status: Secondary | ICD-10-CM | POA: Diagnosis not present

## 2021-09-29 DIAGNOSIS — Z191 Hormone sensitive malignancy status: Secondary | ICD-10-CM | POA: Diagnosis not present

## 2021-09-29 DIAGNOSIS — Z510 Encounter for antineoplastic radiation therapy: Secondary | ICD-10-CM | POA: Diagnosis not present

## 2021-09-29 DIAGNOSIS — C61 Malignant neoplasm of prostate: Secondary | ICD-10-CM | POA: Diagnosis not present

## 2021-10-01 DIAGNOSIS — J449 Chronic obstructive pulmonary disease, unspecified: Secondary | ICD-10-CM | POA: Diagnosis not present

## 2021-10-01 DIAGNOSIS — J9611 Chronic respiratory failure with hypoxia: Secondary | ICD-10-CM | POA: Diagnosis not present

## 2021-10-02 DIAGNOSIS — Z510 Encounter for antineoplastic radiation therapy: Secondary | ICD-10-CM | POA: Diagnosis not present

## 2021-10-02 DIAGNOSIS — C61 Malignant neoplasm of prostate: Secondary | ICD-10-CM | POA: Diagnosis not present

## 2021-10-02 DIAGNOSIS — Z191 Hormone sensitive malignancy status: Secondary | ICD-10-CM | POA: Diagnosis not present

## 2021-10-03 DIAGNOSIS — C61 Malignant neoplasm of prostate: Secondary | ICD-10-CM | POA: Diagnosis not present

## 2021-10-03 DIAGNOSIS — Z191 Hormone sensitive malignancy status: Secondary | ICD-10-CM | POA: Diagnosis not present

## 2021-10-03 DIAGNOSIS — Z510 Encounter for antineoplastic radiation therapy: Secondary | ICD-10-CM | POA: Diagnosis not present

## 2021-10-04 DIAGNOSIS — C61 Malignant neoplasm of prostate: Secondary | ICD-10-CM | POA: Diagnosis not present

## 2021-10-04 DIAGNOSIS — Z191 Hormone sensitive malignancy status: Secondary | ICD-10-CM | POA: Diagnosis not present

## 2021-10-04 DIAGNOSIS — Z510 Encounter for antineoplastic radiation therapy: Secondary | ICD-10-CM | POA: Diagnosis not present

## 2021-10-05 DIAGNOSIS — Z191 Hormone sensitive malignancy status: Secondary | ICD-10-CM | POA: Diagnosis not present

## 2021-10-05 DIAGNOSIS — Z510 Encounter for antineoplastic radiation therapy: Secondary | ICD-10-CM | POA: Diagnosis not present

## 2021-10-05 DIAGNOSIS — C61 Malignant neoplasm of prostate: Secondary | ICD-10-CM | POA: Diagnosis not present

## 2021-10-06 DIAGNOSIS — C61 Malignant neoplasm of prostate: Secondary | ICD-10-CM | POA: Diagnosis not present

## 2021-10-06 DIAGNOSIS — Z191 Hormone sensitive malignancy status: Secondary | ICD-10-CM | POA: Diagnosis not present

## 2021-10-06 DIAGNOSIS — Z510 Encounter for antineoplastic radiation therapy: Secondary | ICD-10-CM | POA: Diagnosis not present

## 2021-10-09 DIAGNOSIS — Z510 Encounter for antineoplastic radiation therapy: Secondary | ICD-10-CM | POA: Diagnosis not present

## 2021-10-09 DIAGNOSIS — Z191 Hormone sensitive malignancy status: Secondary | ICD-10-CM | POA: Diagnosis not present

## 2021-10-09 DIAGNOSIS — C61 Malignant neoplasm of prostate: Secondary | ICD-10-CM | POA: Diagnosis not present

## 2021-10-10 DIAGNOSIS — C61 Malignant neoplasm of prostate: Secondary | ICD-10-CM | POA: Diagnosis not present

## 2021-10-10 DIAGNOSIS — Z191 Hormone sensitive malignancy status: Secondary | ICD-10-CM | POA: Diagnosis not present

## 2021-10-10 DIAGNOSIS — Z510 Encounter for antineoplastic radiation therapy: Secondary | ICD-10-CM | POA: Diagnosis not present

## 2021-10-11 DIAGNOSIS — Z510 Encounter for antineoplastic radiation therapy: Secondary | ICD-10-CM | POA: Diagnosis not present

## 2021-10-11 DIAGNOSIS — C61 Malignant neoplasm of prostate: Secondary | ICD-10-CM | POA: Diagnosis not present

## 2021-10-11 DIAGNOSIS — Z191 Hormone sensitive malignancy status: Secondary | ICD-10-CM | POA: Diagnosis not present

## 2021-10-12 DIAGNOSIS — R351 Nocturia: Secondary | ICD-10-CM | POA: Diagnosis not present

## 2021-10-12 DIAGNOSIS — C61 Malignant neoplasm of prostate: Secondary | ICD-10-CM | POA: Diagnosis not present

## 2021-10-12 DIAGNOSIS — Z510 Encounter for antineoplastic radiation therapy: Secondary | ICD-10-CM | POA: Diagnosis not present

## 2021-10-12 DIAGNOSIS — Z191 Hormone sensitive malignancy status: Secondary | ICD-10-CM | POA: Diagnosis not present

## 2021-10-13 DIAGNOSIS — Z191 Hormone sensitive malignancy status: Secondary | ICD-10-CM | POA: Diagnosis not present

## 2021-10-13 DIAGNOSIS — Z510 Encounter for antineoplastic radiation therapy: Secondary | ICD-10-CM | POA: Diagnosis not present

## 2021-10-13 DIAGNOSIS — C61 Malignant neoplasm of prostate: Secondary | ICD-10-CM | POA: Diagnosis not present

## 2021-10-17 ENCOUNTER — Other Ambulatory Visit: Payer: Self-pay | Admitting: Hematology and Oncology

## 2021-10-17 DIAGNOSIS — N433 Hydrocele, unspecified: Secondary | ICD-10-CM | POA: Diagnosis not present

## 2021-10-17 DIAGNOSIS — Z191 Hormone sensitive malignancy status: Secondary | ICD-10-CM | POA: Diagnosis not present

## 2021-10-17 DIAGNOSIS — N5089 Other specified disorders of the male genital organs: Secondary | ICD-10-CM | POA: Diagnosis not present

## 2021-10-17 DIAGNOSIS — N5082 Scrotal pain: Secondary | ICD-10-CM | POA: Diagnosis not present

## 2021-10-17 DIAGNOSIS — C61 Malignant neoplasm of prostate: Secondary | ICD-10-CM | POA: Diagnosis not present

## 2021-10-17 DIAGNOSIS — N448 Other noninflammatory disorders of the testis: Secondary | ICD-10-CM | POA: Diagnosis not present

## 2021-10-17 DIAGNOSIS — Z510 Encounter for antineoplastic radiation therapy: Secondary | ICD-10-CM | POA: Diagnosis not present

## 2021-10-18 DIAGNOSIS — R351 Nocturia: Secondary | ICD-10-CM | POA: Diagnosis not present

## 2021-10-18 DIAGNOSIS — C61 Malignant neoplasm of prostate: Secondary | ICD-10-CM | POA: Diagnosis not present

## 2021-10-18 DIAGNOSIS — N453 Epididymo-orchitis: Secondary | ICD-10-CM | POA: Diagnosis not present

## 2021-10-18 DIAGNOSIS — Z191 Hormone sensitive malignancy status: Secondary | ICD-10-CM | POA: Diagnosis not present

## 2021-10-18 DIAGNOSIS — Z510 Encounter for antineoplastic radiation therapy: Secondary | ICD-10-CM | POA: Diagnosis not present

## 2021-10-19 DIAGNOSIS — Z510 Encounter for antineoplastic radiation therapy: Secondary | ICD-10-CM | POA: Diagnosis not present

## 2021-10-19 DIAGNOSIS — Z191 Hormone sensitive malignancy status: Secondary | ICD-10-CM | POA: Diagnosis not present

## 2021-10-19 DIAGNOSIS — C61 Malignant neoplasm of prostate: Secondary | ICD-10-CM | POA: Diagnosis not present

## 2021-10-20 DIAGNOSIS — C61 Malignant neoplasm of prostate: Secondary | ICD-10-CM | POA: Diagnosis not present

## 2021-10-20 DIAGNOSIS — Z510 Encounter for antineoplastic radiation therapy: Secondary | ICD-10-CM | POA: Diagnosis not present

## 2021-10-24 DIAGNOSIS — M5136 Other intervertebral disc degeneration, lumbar region: Secondary | ICD-10-CM | POA: Diagnosis not present

## 2021-10-24 DIAGNOSIS — M4302 Spondylolysis, cervical region: Secondary | ICD-10-CM | POA: Diagnosis not present

## 2021-10-24 DIAGNOSIS — M47816 Spondylosis without myelopathy or radiculopathy, lumbar region: Secondary | ICD-10-CM | POA: Diagnosis not present

## 2021-10-24 DIAGNOSIS — C61 Malignant neoplasm of prostate: Secondary | ICD-10-CM | POA: Diagnosis not present

## 2021-10-24 DIAGNOSIS — G894 Chronic pain syndrome: Secondary | ICD-10-CM | POA: Diagnosis not present

## 2021-10-24 DIAGNOSIS — Z1389 Encounter for screening for other disorder: Secondary | ICD-10-CM | POA: Diagnosis not present

## 2021-10-24 DIAGNOSIS — M549 Dorsalgia, unspecified: Secondary | ICD-10-CM | POA: Diagnosis not present

## 2021-10-24 DIAGNOSIS — Z79891 Long term (current) use of opiate analgesic: Secondary | ICD-10-CM | POA: Diagnosis not present

## 2021-11-01 DIAGNOSIS — J449 Chronic obstructive pulmonary disease, unspecified: Secondary | ICD-10-CM | POA: Diagnosis not present

## 2021-11-01 DIAGNOSIS — J9611 Chronic respiratory failure with hypoxia: Secondary | ICD-10-CM | POA: Diagnosis not present

## 2021-11-02 DIAGNOSIS — E7800 Pure hypercholesterolemia, unspecified: Secondary | ICD-10-CM | POA: Diagnosis not present

## 2021-11-02 DIAGNOSIS — J9611 Chronic respiratory failure with hypoxia: Secondary | ICD-10-CM | POA: Diagnosis not present

## 2021-11-02 DIAGNOSIS — C61 Malignant neoplasm of prostate: Secondary | ICD-10-CM | POA: Diagnosis not present

## 2021-11-02 DIAGNOSIS — F1120 Opioid dependence, uncomplicated: Secondary | ICD-10-CM | POA: Diagnosis not present

## 2021-11-02 DIAGNOSIS — I700 Atherosclerosis of aorta: Secondary | ICD-10-CM | POA: Diagnosis not present

## 2021-11-02 DIAGNOSIS — Z79899 Other long term (current) drug therapy: Secondary | ICD-10-CM | POA: Diagnosis not present

## 2021-11-02 DIAGNOSIS — I739 Peripheral vascular disease, unspecified: Secondary | ICD-10-CM | POA: Diagnosis not present

## 2021-11-02 DIAGNOSIS — J449 Chronic obstructive pulmonary disease, unspecified: Secondary | ICD-10-CM | POA: Diagnosis not present

## 2021-11-22 DIAGNOSIS — M4302 Spondylolysis, cervical region: Secondary | ICD-10-CM | POA: Diagnosis not present

## 2021-11-22 DIAGNOSIS — C61 Malignant neoplasm of prostate: Secondary | ICD-10-CM | POA: Diagnosis not present

## 2021-11-22 DIAGNOSIS — Z79891 Long term (current) use of opiate analgesic: Secondary | ICD-10-CM | POA: Diagnosis not present

## 2021-11-22 DIAGNOSIS — M549 Dorsalgia, unspecified: Secondary | ICD-10-CM | POA: Diagnosis not present

## 2021-11-22 DIAGNOSIS — M5136 Other intervertebral disc degeneration, lumbar region: Secondary | ICD-10-CM | POA: Diagnosis not present

## 2021-11-22 DIAGNOSIS — M47816 Spondylosis without myelopathy or radiculopathy, lumbar region: Secondary | ICD-10-CM | POA: Diagnosis not present

## 2021-11-22 DIAGNOSIS — G894 Chronic pain syndrome: Secondary | ICD-10-CM | POA: Diagnosis not present

## 2021-11-22 DIAGNOSIS — Z1389 Encounter for screening for other disorder: Secondary | ICD-10-CM | POA: Diagnosis not present

## 2021-11-27 DIAGNOSIS — C61 Malignant neoplasm of prostate: Secondary | ICD-10-CM | POA: Diagnosis not present

## 2021-11-27 DIAGNOSIS — R351 Nocturia: Secondary | ICD-10-CM | POA: Diagnosis not present

## 2021-12-01 DIAGNOSIS — J449 Chronic obstructive pulmonary disease, unspecified: Secondary | ICD-10-CM | POA: Diagnosis not present

## 2021-12-01 DIAGNOSIS — J9611 Chronic respiratory failure with hypoxia: Secondary | ICD-10-CM | POA: Diagnosis not present

## 2021-12-04 DIAGNOSIS — R600 Localized edema: Secondary | ICD-10-CM | POA: Diagnosis not present

## 2021-12-25 DIAGNOSIS — G894 Chronic pain syndrome: Secondary | ICD-10-CM | POA: Diagnosis not present

## 2021-12-25 DIAGNOSIS — M47816 Spondylosis without myelopathy or radiculopathy, lumbar region: Secondary | ICD-10-CM | POA: Diagnosis not present

## 2021-12-25 DIAGNOSIS — Z79891 Long term (current) use of opiate analgesic: Secondary | ICD-10-CM | POA: Diagnosis not present

## 2021-12-25 DIAGNOSIS — M4302 Spondylolysis, cervical region: Secondary | ICD-10-CM | POA: Diagnosis not present

## 2021-12-25 DIAGNOSIS — M5136 Other intervertebral disc degeneration, lumbar region: Secondary | ICD-10-CM | POA: Diagnosis not present

## 2021-12-25 DIAGNOSIS — C61 Malignant neoplasm of prostate: Secondary | ICD-10-CM | POA: Diagnosis not present

## 2021-12-25 DIAGNOSIS — M549 Dorsalgia, unspecified: Secondary | ICD-10-CM | POA: Diagnosis not present

## 2021-12-25 DIAGNOSIS — Z1389 Encounter for screening for other disorder: Secondary | ICD-10-CM | POA: Diagnosis not present

## 2022-01-01 DIAGNOSIS — J9611 Chronic respiratory failure with hypoxia: Secondary | ICD-10-CM | POA: Diagnosis not present

## 2022-01-01 DIAGNOSIS — J449 Chronic obstructive pulmonary disease, unspecified: Secondary | ICD-10-CM | POA: Diagnosis not present

## 2022-01-08 DIAGNOSIS — C61 Malignant neoplasm of prostate: Secondary | ICD-10-CM | POA: Diagnosis not present

## 2022-01-08 DIAGNOSIS — R351 Nocturia: Secondary | ICD-10-CM | POA: Diagnosis not present

## 2022-01-11 ENCOUNTER — Other Ambulatory Visit: Payer: Self-pay | Admitting: Hematology and Oncology

## 2022-01-11 MED ORDER — TAMSULOSIN HCL 0.4 MG PO CAPS: 0.4000 mg | ORAL_CAPSULE | Freq: Two times a day (BID) | ORAL | 3 refills | Status: DC

## 2022-01-23 DIAGNOSIS — M47816 Spondylosis without myelopathy or radiculopathy, lumbar region: Secondary | ICD-10-CM | POA: Diagnosis not present

## 2022-01-23 DIAGNOSIS — Z79891 Long term (current) use of opiate analgesic: Secondary | ICD-10-CM | POA: Diagnosis not present

## 2022-01-23 DIAGNOSIS — M549 Dorsalgia, unspecified: Secondary | ICD-10-CM | POA: Diagnosis not present

## 2022-01-23 DIAGNOSIS — Z1389 Encounter for screening for other disorder: Secondary | ICD-10-CM | POA: Diagnosis not present

## 2022-01-23 DIAGNOSIS — M4302 Spondylolysis, cervical region: Secondary | ICD-10-CM | POA: Diagnosis not present

## 2022-01-23 DIAGNOSIS — C61 Malignant neoplasm of prostate: Secondary | ICD-10-CM | POA: Diagnosis not present

## 2022-01-23 DIAGNOSIS — G894 Chronic pain syndrome: Secondary | ICD-10-CM | POA: Diagnosis not present

## 2022-01-23 DIAGNOSIS — M5136 Other intervertebral disc degeneration, lumbar region: Secondary | ICD-10-CM | POA: Diagnosis not present

## 2022-02-01 DIAGNOSIS — J9611 Chronic respiratory failure with hypoxia: Secondary | ICD-10-CM | POA: Diagnosis not present

## 2022-02-01 DIAGNOSIS — J449 Chronic obstructive pulmonary disease, unspecified: Secondary | ICD-10-CM | POA: Diagnosis not present

## 2022-02-20 DIAGNOSIS — Z79891 Long term (current) use of opiate analgesic: Secondary | ICD-10-CM | POA: Diagnosis not present

## 2022-02-20 DIAGNOSIS — M4302 Spondylolysis, cervical region: Secondary | ICD-10-CM | POA: Diagnosis not present

## 2022-02-20 DIAGNOSIS — M47816 Spondylosis without myelopathy or radiculopathy, lumbar region: Secondary | ICD-10-CM | POA: Diagnosis not present

## 2022-02-20 DIAGNOSIS — M549 Dorsalgia, unspecified: Secondary | ICD-10-CM | POA: Diagnosis not present

## 2022-02-20 DIAGNOSIS — M5136 Other intervertebral disc degeneration, lumbar region: Secondary | ICD-10-CM | POA: Diagnosis not present

## 2022-02-20 DIAGNOSIS — Z1389 Encounter for screening for other disorder: Secondary | ICD-10-CM | POA: Diagnosis not present

## 2022-02-20 DIAGNOSIS — C61 Malignant neoplasm of prostate: Secondary | ICD-10-CM | POA: Diagnosis not present

## 2022-02-20 DIAGNOSIS — G894 Chronic pain syndrome: Secondary | ICD-10-CM | POA: Diagnosis not present

## 2022-03-03 DIAGNOSIS — J9611 Chronic respiratory failure with hypoxia: Secondary | ICD-10-CM | POA: Diagnosis not present

## 2022-03-03 DIAGNOSIS — J449 Chronic obstructive pulmonary disease, unspecified: Secondary | ICD-10-CM | POA: Diagnosis not present

## 2022-03-05 ENCOUNTER — Ambulatory Visit (INDEPENDENT_AMBULATORY_CARE_PROVIDER_SITE_OTHER): Payer: Medicare PPO | Admitting: Nurse Practitioner

## 2022-03-05 ENCOUNTER — Encounter (INDEPENDENT_AMBULATORY_CARE_PROVIDER_SITE_OTHER): Payer: Self-pay | Admitting: Nurse Practitioner

## 2022-03-05 VITALS — BP 108/59 | HR 69 | Resp 16 | Wt 132.4 lb

## 2022-03-05 DIAGNOSIS — E785 Hyperlipidemia, unspecified: Secondary | ICD-10-CM

## 2022-03-05 DIAGNOSIS — M79605 Pain in left leg: Secondary | ICD-10-CM | POA: Diagnosis not present

## 2022-03-05 DIAGNOSIS — M7989 Other specified soft tissue disorders: Secondary | ICD-10-CM | POA: Diagnosis not present

## 2022-03-05 DIAGNOSIS — I10 Essential (primary) hypertension: Secondary | ICD-10-CM

## 2022-03-05 DIAGNOSIS — F17200 Nicotine dependence, unspecified, uncomplicated: Secondary | ICD-10-CM

## 2022-03-05 DIAGNOSIS — M79604 Pain in right leg: Secondary | ICD-10-CM

## 2022-03-11 ENCOUNTER — Encounter (INDEPENDENT_AMBULATORY_CARE_PROVIDER_SITE_OTHER): Payer: Self-pay | Admitting: Nurse Practitioner

## 2022-03-11 NOTE — Progress Notes (Signed)
Subjective:    Patient ID: Corey Webb, male    DOB: [birth year], 71 y.o.   MRN: [id number] Chief Complaint  Patient presents with   New Patient (Initial Visit)    Ref [place] consult bilateral leg edema    Patient is seen for evaluation of leg pain and leg swelling. The patient first noticed the swelling remotely. The swelling is associated with pain and discoloration. The pain and swelling worsens with prolonged dependency and improves with elevation. The pain is related activity.  The patient notes that in the morning the legs are significantly improved but they steadily worsened throughout the course of the day. The patient also notes a steady worsening of the discoloration in the ankle and shin area.  The swelling has been ongoing for about the last 3 years.  He notes that the left is worse than the right.  He also has COPD and is on home O2  The patient endorses claudication symptoms.  The patient endorses symptoms consistent with rest pain.  The patient does have a history of degenerative disc disease in his back.  The patient has no had any past angiography, interventions or vascular surgery.  Elevation makes the leg symptoms better, dependency makes them much worse. There is no history of ulcerations. The patient denies any recent changes in medications.  The patient has not been wearing graduated compression.  The patient denies a history of DVT or PE. There is no prior history of phlebitis. There is no history of primary lymphedema.  The patient recently underwent prostate radiation in June  The patient denies amaurosis fugax or recent TIA symptoms. There are no recent neurological changes noted. The patient denies recent episodes of angina or shortness of breath     Review of Systems  Respiratory:  Positive for shortness of breath.   Cardiovascular:  Positive for leg swelling.  Musculoskeletal:  Positive for arthralgias, back pain and gait problem.  All other systems  reviewed and are negative.      Objective:   Physical Exam Vitals reviewed.  HENT:     Head: Normocephalic.  Cardiovascular:     Rate and Rhythm: Normal rate.  Pulmonary:     Effort: Pulmonary effort is normal.  Musculoskeletal:     Right lower leg: Edema present.     Left lower leg: Edema present.  Skin:    General: Skin is warm and dry.  Neurological:     Mental Status: He is alert and oriented to person, place, and time.  Psychiatric:        Mood and Affect: Mood normal.        Behavior: Behavior normal.        Thought Content: Thought content normal.        Judgment: Judgment normal.     BP (!) 108/59 (BP Location: Left Arm)   Pulse 69   Resp 16   Wt 132 lb 6.4 oz (60.1 kg)   Past Medical History:  Diagnosis Date   Cancer ([place])    COPD (chronic obstructive pulmonary disease) ([place])    Myocardial infarction ([place])     Social History   Socioeconomic History   Marital status: Married    Spouse name: Not on file   Number of children: Not on file   Years of education: Not on file   Highest education level: Not on file  Occupational History   Not on file  Tobacco Use   Smoking status: Every Day  Types: Cigarettes   Smokeless tobacco: Never  Substance and Sexual Activity   Alcohol use: Never   Drug use: Never   Sexual activity: Not on file  Other Topics Concern   Not on file  Social History Narrative   Not on file   Social Determinants of Health   Financial Resource Strain: Not on file  Food Insecurity: Not on file  Transportation Needs: Not on file  Physical Activity: Not on file  Stress: Not on file  Social Connections: Not on file  Intimate Partner Violence: Not on file    Past Surgical History:  Procedure Laterality Date   BACK SURGERY     CATARACT EXTRACTION Bilateral    EYE SURGERY     HERNIA REPAIR     NECK SURGERY     REFRACTIVE SURGERY Bilateral     Family History  Problem Relation Age of Onset   Cancer Mother    Heart  attack Father     Allergies  Allergen Reactions   Atorvastatin Other (See Comments)    Leg pain Leg pain    Clonazepam Other (See Comments)   Ropinirole Other (See Comments)   Varenicline Other (See Comments)        No data to display            CMP  No results found for: "NA", "K", "CL", "CO2", "GLUCOSE", "BUN", "CREATININE", "CALCIUM", "PROT", "ALBUMIN", "AST", "ALT", "ALKPHOS", "BILITOT", "GFRNONAA", "GFRAA"   No results found.     Assessment & Plan:   1. Leg swelling Recommend:  I have had a long discussion with the patient regarding swelling and why it  causes symptoms.  Patient will begin wearing graduated compression on a daily basis a prescription was given. The patient will  wear the stockings first thing in the morning and removing them in the evening. The patient is instructed specifically not to sleep in the stockings.   In addition, behavioral modification will be initiated.  This will include frequent elevation, use of over the counter pain medications and exercise such as walking.  Consideration for a lymph pump will also be made based upon the effectiveness of conservative therapy.  This would help to improve the edema control and prevent sequela such as ulcers and infections   Patient should undergo duplex ultrasound of the venous system to ensure that DVT or reflux is not present.  The patient will follow-up with me after the ultrasound.    2. Pain in both lower extremities  Recommend:  The patient has atypical pain symptoms for pure atherosclerotic disease. However, on physical exam there is evidence of vascular disease, given the diminished pulses and the edema associated with venous changes of the legs.  Further investigation of the patient's vascular disease is necessary to determine the relationship of the patient's lower extremity symptoms and the degree of vascular disease.  Noninvasive studies of the will be obtained and the patient will  follow up with me to review these studies.  I suspect the patient is c/o pseudoclaudication.  Patient should have an evaluation of his LS spine which I defer to either the primary service or the Spine service.  The patient should continue walking and begin a more formal exercise program. The patient should continue his antiplatelet therapy and aggressive treatment of the lipid abnormalities.   3. Smoking Smoking cessation was discussed, 3-10 minutes spent on this topic specifically   4. Essential hypertension Continue antihypertensive medications as already ordered, these medications have been  reviewed and there are no changes at this time.   5. Dyslipidemia Continue statin as ordered and reviewed, no changes at this time    Current Outpatient Medications on File Prior to Visit  Medication Sig Dispense Refill   acetaminophen (TYLENOL) 500 MG tablet Take 500 mg by mouth every 6 (six) hours as needed.     albuterol (ACCUNEB) 0.63 MG/3ML nebulizer solution Take 1 ampule by nebulization every 6 (six) hours as needed for wheezing.     aspirin 325 MG tablet Take by mouth.     bicalutamide (CASODEX) 50 MG tablet Take 50 mg by mouth daily.     DULoxetine (CYMBALTA) 60 MG capsule TAKE 1 CAPSULE BY MOUTH DAILY     Oxycodone HCl 10 MG TABS      pravastatin (PRAVACHOL) 40 MG tablet TAKE 1 TABLET BY MOUTH AT BEDTIME FOR HIGH CHOLESTEROL     tamsulosin (FLOMAX) 0.4 MG CAPS capsule Take 1 capsule (0.4 mg total) by mouth 2 (two) times daily. 60 capsule 3   TRELEGY ELLIPTA 100-62.5-25 MCG/ACT AEPB Inhale 1 puff into the lungs daily.     verapamil (VERELAN PM) 180 MG 24 hr capsule Take by mouth.     diclofenac (VOLTAREN) 75 MG EC tablet Take 1 tablet (75 mg total) by mouth 2 (two) times daily. (Patient not taking: Reported on 03/05/2022) 30 tablet 0   Current Facility-Administered Medications on File Prior to Visit  Medication Dose Route Frequency Provider Last Rate Last Admin   triamcinolone  acetonide (KENALOG) 10 MG/ML injection 10 mg  10 mg Other Once Landis Martins, DPM        There are no Patient Instructions on file for this visit. No follow-ups on file.   Kris Hartmann, NP

## 2022-03-21 DIAGNOSIS — M4302 Spondylolysis, cervical region: Secondary | ICD-10-CM | POA: Diagnosis not present

## 2022-03-21 DIAGNOSIS — M549 Dorsalgia, unspecified: Secondary | ICD-10-CM | POA: Diagnosis not present

## 2022-03-21 DIAGNOSIS — C61 Malignant neoplasm of prostate: Secondary | ICD-10-CM | POA: Diagnosis not present

## 2022-03-21 DIAGNOSIS — Z1389 Encounter for screening for other disorder: Secondary | ICD-10-CM | POA: Diagnosis not present

## 2022-03-21 DIAGNOSIS — Z79891 Long term (current) use of opiate analgesic: Secondary | ICD-10-CM | POA: Diagnosis not present

## 2022-03-21 DIAGNOSIS — G894 Chronic pain syndrome: Secondary | ICD-10-CM | POA: Diagnosis not present

## 2022-03-21 DIAGNOSIS — M5136 Other intervertebral disc degeneration, lumbar region: Secondary | ICD-10-CM | POA: Diagnosis not present

## 2022-03-21 DIAGNOSIS — M47816 Spondylosis without myelopathy or radiculopathy, lumbar region: Secondary | ICD-10-CM | POA: Diagnosis not present

## 2022-03-28 ENCOUNTER — Encounter (INDEPENDENT_AMBULATORY_CARE_PROVIDER_SITE_OTHER): Payer: Medicare PPO

## 2022-03-28 ENCOUNTER — Ambulatory Visit (INDEPENDENT_AMBULATORY_CARE_PROVIDER_SITE_OTHER): Payer: Medicare PPO | Admitting: Nurse Practitioner

## 2022-03-30 ENCOUNTER — Other Ambulatory Visit (INDEPENDENT_AMBULATORY_CARE_PROVIDER_SITE_OTHER): Payer: Self-pay | Admitting: Nurse Practitioner

## 2022-03-30 DIAGNOSIS — R600 Localized edema: Secondary | ICD-10-CM

## 2022-04-03 DIAGNOSIS — J9611 Chronic respiratory failure with hypoxia: Secondary | ICD-10-CM | POA: Diagnosis not present

## 2022-04-03 DIAGNOSIS — J449 Chronic obstructive pulmonary disease, unspecified: Secondary | ICD-10-CM | POA: Diagnosis not present

## 2022-04-04 ENCOUNTER — Ambulatory Visit (INDEPENDENT_AMBULATORY_CARE_PROVIDER_SITE_OTHER): Payer: Medicare PPO | Admitting: Nurse Practitioner

## 2022-04-04 ENCOUNTER — Ambulatory Visit (INDEPENDENT_AMBULATORY_CARE_PROVIDER_SITE_OTHER): Payer: Medicare PPO

## 2022-04-04 ENCOUNTER — Encounter (INDEPENDENT_AMBULATORY_CARE_PROVIDER_SITE_OTHER): Payer: Self-pay | Admitting: Nurse Practitioner

## 2022-04-04 VITALS — BP 126/62 | HR 85 | Resp 16 | Wt 136.0 lb

## 2022-04-04 DIAGNOSIS — R600 Localized edema: Secondary | ICD-10-CM

## 2022-04-04 DIAGNOSIS — M79604 Pain in right leg: Secondary | ICD-10-CM | POA: Diagnosis not present

## 2022-04-04 DIAGNOSIS — E785 Hyperlipidemia, unspecified: Secondary | ICD-10-CM

## 2022-04-04 DIAGNOSIS — M79605 Pain in left leg: Secondary | ICD-10-CM | POA: Diagnosis not present

## 2022-04-04 DIAGNOSIS — I10 Essential (primary) hypertension: Secondary | ICD-10-CM | POA: Diagnosis not present

## 2022-04-04 DIAGNOSIS — F17200 Nicotine dependence, unspecified, uncomplicated: Secondary | ICD-10-CM | POA: Diagnosis not present

## 2022-04-04 DIAGNOSIS — M7989 Other specified soft tissue disorders: Secondary | ICD-10-CM

## 2022-04-13 ENCOUNTER — Other Ambulatory Visit: Payer: Self-pay | Admitting: Hematology and Oncology

## 2022-04-18 DIAGNOSIS — R0602 Shortness of breath: Secondary | ICD-10-CM | POA: Diagnosis not present

## 2022-04-18 DIAGNOSIS — I959 Hypotension, unspecified: Secondary | ICD-10-CM | POA: Diagnosis not present

## 2022-04-18 DIAGNOSIS — J9690 Respiratory failure, unspecified, unspecified whether with hypoxia or hypercapnia: Secondary | ICD-10-CM | POA: Diagnosis not present

## 2022-04-18 DIAGNOSIS — Z9989 Dependence on other enabling machines and devices: Secondary | ICD-10-CM | POA: Diagnosis not present

## 2022-04-18 DIAGNOSIS — R42 Dizziness and giddiness: Secondary | ICD-10-CM | POA: Diagnosis not present

## 2022-04-18 DIAGNOSIS — J159 Unspecified bacterial pneumonia: Secondary | ICD-10-CM | POA: Diagnosis not present

## 2022-04-18 DIAGNOSIS — E43 Unspecified severe protein-calorie malnutrition: Secondary | ICD-10-CM | POA: Diagnosis not present

## 2022-04-18 DIAGNOSIS — Z720 Tobacco use: Secondary | ICD-10-CM | POA: Diagnosis not present

## 2022-04-18 DIAGNOSIS — I700 Atherosclerosis of aorta: Secondary | ICD-10-CM | POA: Diagnosis not present

## 2022-04-18 DIAGNOSIS — J440 Chronic obstructive pulmonary disease with acute lower respiratory infection: Secondary | ICD-10-CM | POA: Diagnosis not present

## 2022-04-18 DIAGNOSIS — M7989 Other specified soft tissue disorders: Secondary | ICD-10-CM | POA: Diagnosis not present

## 2022-04-18 DIAGNOSIS — R0902 Hypoxemia: Secondary | ICD-10-CM | POA: Diagnosis not present

## 2022-04-18 DIAGNOSIS — E222 Syndrome of inappropriate secretion of antidiuretic hormone: Secondary | ICD-10-CM | POA: Diagnosis not present

## 2022-04-18 DIAGNOSIS — J439 Emphysema, unspecified: Secondary | ICD-10-CM | POA: Diagnosis not present

## 2022-04-18 DIAGNOSIS — I4891 Unspecified atrial fibrillation: Secondary | ICD-10-CM | POA: Diagnosis not present

## 2022-04-18 DIAGNOSIS — J9621 Acute and chronic respiratory failure with hypoxia: Secondary | ICD-10-CM | POA: Diagnosis not present

## 2022-04-18 DIAGNOSIS — E875 Hyperkalemia: Secondary | ICD-10-CM | POA: Diagnosis not present

## 2022-04-18 DIAGNOSIS — J449 Chronic obstructive pulmonary disease, unspecified: Secondary | ICD-10-CM | POA: Diagnosis not present

## 2022-04-18 DIAGNOSIS — J121 Respiratory syncytial virus pneumonia: Secondary | ICD-10-CM | POA: Diagnosis not present

## 2022-04-18 DIAGNOSIS — J189 Pneumonia, unspecified organism: Secondary | ICD-10-CM | POA: Diagnosis not present

## 2022-04-18 DIAGNOSIS — Z681 Body mass index (BMI) 19 or less, adult: Secondary | ICD-10-CM | POA: Diagnosis not present

## 2022-04-18 DIAGNOSIS — Z9981 Dependence on supplemental oxygen: Secondary | ICD-10-CM | POA: Diagnosis not present

## 2022-04-18 DIAGNOSIS — J441 Chronic obstructive pulmonary disease with (acute) exacerbation: Secondary | ICD-10-CM | POA: Diagnosis not present

## 2022-04-18 DIAGNOSIS — R9431 Abnormal electrocardiogram [ECG] [EKG]: Secondary | ICD-10-CM | POA: Diagnosis not present

## 2022-04-21 DIAGNOSIS — I4891 Unspecified atrial fibrillation: Secondary | ICD-10-CM | POA: Diagnosis not present

## 2022-04-22 ENCOUNTER — Encounter (INDEPENDENT_AMBULATORY_CARE_PROVIDER_SITE_OTHER): Payer: Self-pay | Admitting: Nurse Practitioner

## 2022-04-22 NOTE — Progress Notes (Signed)
Subjective:    Patient ID: Corey Webb, male    DOB: 08-01-1950, 71 y.o.   MRN: [id number] Chief Complaint  Patient presents with   Follow-up    Ultrasound follow up    Patient returns for evaluation of leg pain and leg swelling. The patient first noticed the swelling remotely. The swelling is associated with pain and discoloration. The pain and swelling worsens with prolonged dependency and improves with elevation. The pain is related activity.  The patient notes that in the morning the legs are significantly improved but they steadily worsened throughout the course of the day. The patient also notes a steady worsening of the discoloration in the ankle and shin area.  The swelling has been ongoing for about the last 3 years.  He notes that the left is worse than the right.  He also has COPD and is on home O2  The patient endorses claudication symptoms.  The patient endorses symptoms consistent with rest pain.  The patient does have a history of degenerative disc disease in his back.  The patient has no had any past angiography, interventions or vascular surgery.  Elevation makes the leg symptoms better, dependency makes them much worse. There is no history of ulcerations. The patient denies any recent changes in medications.  The patient has not been wearing graduated compression.  The patient denies a history of DVT or PE. There is no prior history of phlebitis. There is no history of primary lymphedema.  The patient recently underwent prostate radiation in June  The patient denies amaurosis fugax or recent TIA symptoms. There are no recent neurological changes noted. The patient denies recent episodes of angina or shortness of breath   Today the patient has ABI of 1.21 and a left of 1.19.  The patient has strong triphasic tibial artery waveforms bilaterally with good toe waveforms bilaterally.  The patient has no evidence of DVT or superficial thrombophlebitis bilaterally.  No  evidence of deep venous insufficiency or superficial venous reflux bilaterally.    Review of Systems  Respiratory:  Positive for shortness of breath.   Cardiovascular:  Positive for leg swelling.  Musculoskeletal:  Positive for arthralgias, back pain and gait problem.  All other systems reviewed and are negative.      Objective:   Physical Exam Vitals reviewed.  HENT:     Head: Normocephalic.  Cardiovascular:     Rate and Rhythm: Normal rate.  Pulmonary:     Effort: Pulmonary effort is normal.  Musculoskeletal:     Right lower leg: Edema present.     Left lower leg: Edema present.  Skin:    General: Skin is warm and dry.  Neurological:     Mental Status: He is alert and oriented to person, place, and time.  Psychiatric:        Mood and Affect: Mood normal.        Behavior: Behavior normal.        Thought Content: Thought content normal.        Judgment: Judgment normal.     BP 126/62 (BP Location: Left Arm)   Pulse 85   Resp 16   Wt 136 lb (61.7 kg)   Past Medical History:  Diagnosis Date   Cancer ([place])    COPD (chronic obstructive pulmonary disease) (HCC)    Myocardial infarction ([place])     Social History   Socioeconomic History   Marital status: Married    Spouse name: Not on file  Number of children: Not on file   Years of education: Not on file   Highest education level: Not on file  Occupational History   Not on file  Tobacco Use   Smoking status: Every Day    Types: Cigarettes   Smokeless tobacco: Never  Substance and Sexual Activity   Alcohol use: Never   Drug use: Never   Sexual activity: Not on file  Other Topics Concern   Not on file  Social History Narrative   Not on file   Social Determinants of Health   Financial Resource Strain: Not on file  Food Insecurity: Not on file  Transportation Needs: Not on file  Physical Activity: Not on file  Stress: Not on file  Social Connections: Not on file  Intimate Partner Violence: Not on  file    Past Surgical History:  Procedure Laterality Date   BACK SURGERY     CATARACT EXTRACTION Bilateral    EYE SURGERY     HERNIA REPAIR     NECK SURGERY     REFRACTIVE SURGERY Bilateral     Family History  Problem Relation Age of Onset   Cancer Mother    Heart attack Father     Allergies  Allergen Reactions   Atorvastatin Other (See Comments)    Leg pain Leg pain    Clonazepam Other (See Comments)   Ropinirole Other (See Comments)   Varenicline Other (See Comments)        No data to display             CMP  No results found for: "NA", "K", "CL", "CO2", "GLUCOSE", "BUN", "CREATININE", "CALCIUM", "PROT", "ALBUMIN", "AST", "ALT", "ALKPHOS", "BILITOT", "GFRNONAA", "GFRAA"   No results found.     Assessment & Plan:   1. Leg swelling Recommend:  I have had a long discussion with the patient regarding swelling and why it  causes symptoms.  Patient will begin wearing graduated compression on a daily basis a prescription was given. The patient will  wear the stockings first thing in the morning and removing them in the evening. The patient is instructed specifically not to sleep in the stockings.   In addition, behavioral modification will be initiated.  This will include frequent elevation, use of over the counter pain medications and exercise such as walking.  Consideration for a lymph pump will also be made based upon the effectiveness of conservative therapy.  This would help to improve the edema control and prevent sequela such as ulcers and infections   Patient should undergo duplex ultrasound of the venous system to ensure that DVT or reflux is not present.  The patient will follow-up with me after the ultrasound.    Patient will return in 3 months for evaluation of lower extremity edema  2. Pain in both lower extremities  Recommend:  The patient has atypical pain symptoms for vascular disease and on exam I do not find evidence of vascular  pathology that would explain the patient's symptoms.  Noninvasive studies do not identify significant vascular problems  I suspect the patient is c/o pseudoclaudication.  Patient should have an evaluation of the LS spine which I defer to the primary service or the Spine service.  The patient should continue walking and begin a more formal exercise program. The patient should continue his antiplatelet therapy and aggressive treatment of the lipid abnormalities.  Patient will follow-up with me on a PRN basis.  3. Smoking Smoking cessation was discussed, 3-10 minutes spent  on this topic specifically   4. Essential hypertension Continue antihypertensive medications as already ordered, these medications have been reviewed and there are no changes at this time.   5. Dyslipidemia Continue statin as ordered and reviewed, no changes at this time    Current Outpatient Medications on File Prior to Visit  Medication Sig Dispense Refill   acetaminophen (TYLENOL) 500 MG tablet Take 500 mg by mouth every 6 (six) hours as needed.     albuterol (ACCUNEB) 0.63 MG/3ML nebulizer solution Take 1 ampule by nebulization every 6 (six) hours as needed for wheezing.     aspirin 325 MG tablet Take by mouth.     bicalutamide (CASODEX) 50 MG tablet Take 50 mg by mouth daily.     DULoxetine (CYMBALTA) 60 MG capsule TAKE 1 CAPSULE BY MOUTH DAILY     Oxycodone HCl 10 MG TABS      pravastatin (PRAVACHOL) 40 MG tablet TAKE 1 TABLET BY MOUTH AT BEDTIME FOR HIGH CHOLESTEROL     tamsulosin (FLOMAX) 0.4 MG CAPS capsule Take 1 capsule (0.4 mg total) by mouth 2 (two) times daily. 60 capsule 3   TRELEGY ELLIPTA 100-62.5-25 MCG/ACT AEPB Inhale 1 puff into the lungs daily.     verapamil (VERELAN PM) 180 MG 24 hr capsule Take by mouth.     diclofenac (VOLTAREN) 75 MG EC tablet Take 1 tablet (75 mg total) by mouth 2 (two) times daily. (Patient not taking: Reported on 03/05/2022) 30 tablet 0   Current Facility-Administered  Medications on File Prior to Visit  Medication Dose Route Frequency Provider Last Rate Last Admin   triamcinolone acetonide (KENALOG) 10 MG/ML injection 10 mg  10 mg Other Once Landis Martins, DPM        There are no Patient Instructions on file for this visit. No follow-ups on file.   Kris Hartmann, NP

## 2022-04-26 DIAGNOSIS — M549 Dorsalgia, unspecified: Secondary | ICD-10-CM | POA: Diagnosis not present

## 2022-04-26 DIAGNOSIS — M4302 Spondylolysis, cervical region: Secondary | ICD-10-CM | POA: Diagnosis not present

## 2022-04-26 DIAGNOSIS — Z1389 Encounter for screening for other disorder: Secondary | ICD-10-CM | POA: Diagnosis not present

## 2022-04-26 DIAGNOSIS — M5136 Other intervertebral disc degeneration, lumbar region: Secondary | ICD-10-CM | POA: Diagnosis not present

## 2022-04-26 DIAGNOSIS — G894 Chronic pain syndrome: Secondary | ICD-10-CM | POA: Diagnosis not present

## 2022-04-26 DIAGNOSIS — C61 Malignant neoplasm of prostate: Secondary | ICD-10-CM | POA: Diagnosis not present

## 2022-04-26 DIAGNOSIS — Z79891 Long term (current) use of opiate analgesic: Secondary | ICD-10-CM | POA: Diagnosis not present

## 2022-04-26 DIAGNOSIS — M47816 Spondylosis without myelopathy or radiculopathy, lumbar region: Secondary | ICD-10-CM | POA: Diagnosis not present

## 2022-04-30 DIAGNOSIS — J9612 Chronic respiratory failure with hypercapnia: Secondary | ICD-10-CM | POA: Diagnosis not present

## 2022-04-30 DIAGNOSIS — Z79899 Other long term (current) drug therapy: Secondary | ICD-10-CM | POA: Diagnosis not present

## 2022-04-30 DIAGNOSIS — J441 Chronic obstructive pulmonary disease with (acute) exacerbation: Secondary | ICD-10-CM | POA: Diagnosis not present

## 2022-04-30 DIAGNOSIS — E871 Hypo-osmolality and hyponatremia: Secondary | ICD-10-CM | POA: Diagnosis not present

## 2022-04-30 DIAGNOSIS — D649 Anemia, unspecified: Secondary | ICD-10-CM | POA: Diagnosis not present

## 2022-04-30 DIAGNOSIS — J9611 Chronic respiratory failure with hypoxia: Secondary | ICD-10-CM | POA: Diagnosis not present

## 2022-04-30 DIAGNOSIS — J121 Respiratory syncytial virus pneumonia: Secondary | ICD-10-CM | POA: Diagnosis not present

## 2022-04-30 DIAGNOSIS — G4700 Insomnia, unspecified: Secondary | ICD-10-CM | POA: Diagnosis not present

## 2022-05-01 DIAGNOSIS — J449 Chronic obstructive pulmonary disease, unspecified: Secondary | ICD-10-CM | POA: Diagnosis not present

## 2022-05-01 DIAGNOSIS — J9612 Chronic respiratory failure with hypercapnia: Secondary | ICD-10-CM | POA: Diagnosis not present

## 2022-05-01 DIAGNOSIS — J9611 Chronic respiratory failure with hypoxia: Secondary | ICD-10-CM | POA: Diagnosis not present

## 2022-05-03 DIAGNOSIS — J449 Chronic obstructive pulmonary disease, unspecified: Secondary | ICD-10-CM | POA: Diagnosis not present

## 2022-05-03 DIAGNOSIS — J9611 Chronic respiratory failure with hypoxia: Secondary | ICD-10-CM | POA: Diagnosis not present

## 2022-05-09 DIAGNOSIS — D649 Anemia, unspecified: Secondary | ICD-10-CM | POA: Diagnosis not present

## 2022-05-09 DIAGNOSIS — E871 Hypo-osmolality and hyponatremia: Secondary | ICD-10-CM | POA: Diagnosis not present

## 2022-05-15 DIAGNOSIS — J9611 Chronic respiratory failure with hypoxia: Secondary | ICD-10-CM | POA: Diagnosis not present

## 2022-05-15 DIAGNOSIS — J9612 Chronic respiratory failure with hypercapnia: Secondary | ICD-10-CM | POA: Diagnosis not present

## 2022-05-15 DIAGNOSIS — J449 Chronic obstructive pulmonary disease, unspecified: Secondary | ICD-10-CM | POA: Diagnosis not present

## 2022-05-19 ENCOUNTER — Other Ambulatory Visit: Payer: Self-pay | Admitting: Hematology and Oncology

## 2022-05-28 DIAGNOSIS — Z87891 Personal history of nicotine dependence: Secondary | ICD-10-CM | POA: Diagnosis not present

## 2022-05-28 DIAGNOSIS — Z122 Encounter for screening for malignant neoplasm of respiratory organs: Secondary | ICD-10-CM | POA: Diagnosis not present

## 2022-05-28 DIAGNOSIS — F17210 Nicotine dependence, cigarettes, uncomplicated: Secondary | ICD-10-CM | POA: Diagnosis not present

## 2022-05-28 DIAGNOSIS — R69 Illness, unspecified: Secondary | ICD-10-CM | POA: Diagnosis not present

## 2022-05-29 DIAGNOSIS — M4302 Spondylolysis, cervical region: Secondary | ICD-10-CM | POA: Diagnosis not present

## 2022-05-29 DIAGNOSIS — J449 Chronic obstructive pulmonary disease, unspecified: Secondary | ICD-10-CM | POA: Diagnosis not present

## 2022-05-29 DIAGNOSIS — S32010A Wedge compression fracture of first lumbar vertebra, initial encounter for closed fracture: Secondary | ICD-10-CM | POA: Diagnosis not present

## 2022-05-29 DIAGNOSIS — S22080A Wedge compression fracture of T11-T12 vertebra, initial encounter for closed fracture: Secondary | ICD-10-CM | POA: Diagnosis not present

## 2022-05-29 DIAGNOSIS — Z1389 Encounter for screening for other disorder: Secondary | ICD-10-CM | POA: Diagnosis not present

## 2022-05-29 DIAGNOSIS — G894 Chronic pain syndrome: Secondary | ICD-10-CM | POA: Diagnosis not present

## 2022-05-29 DIAGNOSIS — M549 Dorsalgia, unspecified: Secondary | ICD-10-CM | POA: Diagnosis not present

## 2022-05-29 DIAGNOSIS — Z79891 Long term (current) use of opiate analgesic: Secondary | ICD-10-CM | POA: Diagnosis not present

## 2022-05-29 DIAGNOSIS — M5136 Other intervertebral disc degeneration, lumbar region: Secondary | ICD-10-CM | POA: Diagnosis not present

## 2022-05-29 DIAGNOSIS — M47816 Spondylosis without myelopathy or radiculopathy, lumbar region: Secondary | ICD-10-CM | POA: Diagnosis not present

## 2022-05-29 DIAGNOSIS — C61 Malignant neoplasm of prostate: Secondary | ICD-10-CM | POA: Diagnosis not present

## 2022-06-03 DIAGNOSIS — J449 Chronic obstructive pulmonary disease, unspecified: Secondary | ICD-10-CM | POA: Diagnosis not present

## 2022-06-03 DIAGNOSIS — J9611 Chronic respiratory failure with hypoxia: Secondary | ICD-10-CM | POA: Diagnosis not present

## 2022-06-12 DIAGNOSIS — J449 Chronic obstructive pulmonary disease, unspecified: Secondary | ICD-10-CM | POA: Diagnosis not present

## 2022-06-15 DIAGNOSIS — R918 Other nonspecific abnormal finding of lung field: Secondary | ICD-10-CM | POA: Diagnosis not present

## 2022-06-15 DIAGNOSIS — J439 Emphysema, unspecified: Secondary | ICD-10-CM | POA: Diagnosis not present

## 2022-06-15 DIAGNOSIS — J479 Bronchiectasis, uncomplicated: Secondary | ICD-10-CM | POA: Diagnosis not present

## 2022-06-18 DIAGNOSIS — D649 Anemia, unspecified: Secondary | ICD-10-CM | POA: Diagnosis not present

## 2022-06-20 DIAGNOSIS — J9612 Chronic respiratory failure with hypercapnia: Secondary | ICD-10-CM | POA: Diagnosis not present

## 2022-06-20 DIAGNOSIS — R69 Illness, unspecified: Secondary | ICD-10-CM | POA: Diagnosis not present

## 2022-06-20 DIAGNOSIS — J449 Chronic obstructive pulmonary disease, unspecified: Secondary | ICD-10-CM | POA: Diagnosis not present

## 2022-06-20 DIAGNOSIS — J9611 Chronic respiratory failure with hypoxia: Secondary | ICD-10-CM | POA: Diagnosis not present

## 2022-06-20 DIAGNOSIS — I2510 Atherosclerotic heart disease of native coronary artery without angina pectoris: Secondary | ICD-10-CM | POA: Diagnosis not present

## 2022-06-27 DIAGNOSIS — C61 Malignant neoplasm of prostate: Secondary | ICD-10-CM | POA: Diagnosis not present

## 2022-06-27 DIAGNOSIS — S22080A Wedge compression fracture of T11-T12 vertebra, initial encounter for closed fracture: Secondary | ICD-10-CM | POA: Diagnosis not present

## 2022-06-27 DIAGNOSIS — Z1389 Encounter for screening for other disorder: Secondary | ICD-10-CM | POA: Diagnosis not present

## 2022-06-27 DIAGNOSIS — M4302 Spondylolysis, cervical region: Secondary | ICD-10-CM | POA: Diagnosis not present

## 2022-06-27 DIAGNOSIS — M5136 Other intervertebral disc degeneration, lumbar region: Secondary | ICD-10-CM | POA: Diagnosis not present

## 2022-06-27 DIAGNOSIS — S32010A Wedge compression fracture of first lumbar vertebra, initial encounter for closed fracture: Secondary | ICD-10-CM | POA: Diagnosis not present

## 2022-06-27 DIAGNOSIS — Z79891 Long term (current) use of opiate analgesic: Secondary | ICD-10-CM | POA: Diagnosis not present

## 2022-06-27 DIAGNOSIS — M47816 Spondylosis without myelopathy or radiculopathy, lumbar region: Secondary | ICD-10-CM | POA: Diagnosis not present

## 2022-06-27 DIAGNOSIS — G894 Chronic pain syndrome: Secondary | ICD-10-CM | POA: Diagnosis not present

## 2022-06-27 DIAGNOSIS — M549 Dorsalgia, unspecified: Secondary | ICD-10-CM | POA: Diagnosis not present

## 2022-06-30 DIAGNOSIS — J449 Chronic obstructive pulmonary disease, unspecified: Secondary | ICD-10-CM | POA: Diagnosis not present

## 2022-06-30 DIAGNOSIS — J9611 Chronic respiratory failure with hypoxia: Secondary | ICD-10-CM | POA: Diagnosis not present

## 2022-07-04 DIAGNOSIS — J9611 Chronic respiratory failure with hypoxia: Secondary | ICD-10-CM | POA: Diagnosis not present

## 2022-07-04 DIAGNOSIS — J449 Chronic obstructive pulmonary disease, unspecified: Secondary | ICD-10-CM | POA: Diagnosis not present

## 2022-07-05 ENCOUNTER — Ambulatory Visit (INDEPENDENT_AMBULATORY_CARE_PROVIDER_SITE_OTHER): Payer: Medicare PPO | Admitting: Nurse Practitioner

## 2022-07-24 DIAGNOSIS — M549 Dorsalgia, unspecified: Secondary | ICD-10-CM | POA: Diagnosis not present

## 2022-07-24 DIAGNOSIS — M47816 Spondylosis without myelopathy or radiculopathy, lumbar region: Secondary | ICD-10-CM | POA: Diagnosis not present

## 2022-07-24 DIAGNOSIS — M5136 Other intervertebral disc degeneration, lumbar region: Secondary | ICD-10-CM | POA: Diagnosis not present

## 2022-07-24 DIAGNOSIS — Z1389 Encounter for screening for other disorder: Secondary | ICD-10-CM | POA: Diagnosis not present

## 2022-07-24 DIAGNOSIS — C61 Malignant neoplasm of prostate: Secondary | ICD-10-CM | POA: Diagnosis not present

## 2022-07-24 DIAGNOSIS — M4302 Spondylolysis, cervical region: Secondary | ICD-10-CM | POA: Diagnosis not present

## 2022-07-24 DIAGNOSIS — G894 Chronic pain syndrome: Secondary | ICD-10-CM | POA: Diagnosis not present

## 2022-07-24 DIAGNOSIS — S22080A Wedge compression fracture of T11-T12 vertebra, initial encounter for closed fracture: Secondary | ICD-10-CM | POA: Diagnosis not present

## 2022-07-24 DIAGNOSIS — S32010A Wedge compression fracture of first lumbar vertebra, initial encounter for closed fracture: Secondary | ICD-10-CM | POA: Diagnosis not present

## 2022-07-24 DIAGNOSIS — Z79891 Long term (current) use of opiate analgesic: Secondary | ICD-10-CM | POA: Diagnosis not present

## 2022-07-29 DIAGNOSIS — J449 Chronic obstructive pulmonary disease, unspecified: Secondary | ICD-10-CM | POA: Diagnosis not present

## 2022-07-29 DIAGNOSIS — J9611 Chronic respiratory failure with hypoxia: Secondary | ICD-10-CM | POA: Diagnosis not present

## 2022-07-31 DIAGNOSIS — I2510 Atherosclerotic heart disease of native coronary artery without angina pectoris: Secondary | ICD-10-CM | POA: Diagnosis not present

## 2022-08-02 DIAGNOSIS — J9611 Chronic respiratory failure with hypoxia: Secondary | ICD-10-CM | POA: Diagnosis not present

## 2022-08-02 DIAGNOSIS — J449 Chronic obstructive pulmonary disease, unspecified: Secondary | ICD-10-CM | POA: Diagnosis not present

## 2022-08-14 DIAGNOSIS — G4733 Obstructive sleep apnea (adult) (pediatric): Secondary | ICD-10-CM | POA: Diagnosis not present

## 2022-08-15 DIAGNOSIS — R69 Illness, unspecified: Secondary | ICD-10-CM | POA: Diagnosis not present

## 2022-08-15 DIAGNOSIS — I2510 Atherosclerotic heart disease of native coronary artery without angina pectoris: Secondary | ICD-10-CM | POA: Diagnosis not present

## 2022-08-15 DIAGNOSIS — J449 Chronic obstructive pulmonary disease, unspecified: Secondary | ICD-10-CM | POA: Diagnosis not present

## 2022-08-15 DIAGNOSIS — J9612 Chronic respiratory failure with hypercapnia: Secondary | ICD-10-CM | POA: Diagnosis not present

## 2022-08-15 DIAGNOSIS — J9611 Chronic respiratory failure with hypoxia: Secondary | ICD-10-CM | POA: Diagnosis not present

## 2022-08-15 DIAGNOSIS — G4761 Periodic limb movement disorder: Secondary | ICD-10-CM | POA: Diagnosis not present

## 2022-08-21 DIAGNOSIS — M549 Dorsalgia, unspecified: Secondary | ICD-10-CM | POA: Diagnosis not present

## 2022-08-21 DIAGNOSIS — M47816 Spondylosis without myelopathy or radiculopathy, lumbar region: Secondary | ICD-10-CM | POA: Diagnosis not present

## 2022-08-21 DIAGNOSIS — C61 Malignant neoplasm of prostate: Secondary | ICD-10-CM | POA: Diagnosis not present

## 2022-08-21 DIAGNOSIS — Z79891 Long term (current) use of opiate analgesic: Secondary | ICD-10-CM | POA: Diagnosis not present

## 2022-08-21 DIAGNOSIS — M4302 Spondylolysis, cervical region: Secondary | ICD-10-CM | POA: Diagnosis not present

## 2022-08-21 DIAGNOSIS — Z1389 Encounter for screening for other disorder: Secondary | ICD-10-CM | POA: Diagnosis not present

## 2022-08-21 DIAGNOSIS — M5136 Other intervertebral disc degeneration, lumbar region: Secondary | ICD-10-CM | POA: Diagnosis not present

## 2022-08-21 DIAGNOSIS — G894 Chronic pain syndrome: Secondary | ICD-10-CM | POA: Diagnosis not present

## 2022-08-21 DIAGNOSIS — S32010A Wedge compression fracture of first lumbar vertebra, initial encounter for closed fracture: Secondary | ICD-10-CM | POA: Diagnosis not present

## 2022-08-21 DIAGNOSIS — S22080A Wedge compression fracture of T11-T12 vertebra, initial encounter for closed fracture: Secondary | ICD-10-CM | POA: Diagnosis not present

## 2022-08-27 DIAGNOSIS — I2510 Atherosclerotic heart disease of native coronary artery without angina pectoris: Secondary | ICD-10-CM | POA: Diagnosis not present

## 2022-08-27 DIAGNOSIS — R079 Chest pain, unspecified: Secondary | ICD-10-CM | POA: Diagnosis not present

## 2022-08-29 DIAGNOSIS — J9611 Chronic respiratory failure with hypoxia: Secondary | ICD-10-CM | POA: Diagnosis not present

## 2022-08-29 DIAGNOSIS — J449 Chronic obstructive pulmonary disease, unspecified: Secondary | ICD-10-CM | POA: Diagnosis not present

## 2022-09-02 DIAGNOSIS — J449 Chronic obstructive pulmonary disease, unspecified: Secondary | ICD-10-CM | POA: Diagnosis not present

## 2022-09-02 DIAGNOSIS — J9611 Chronic respiratory failure with hypoxia: Secondary | ICD-10-CM | POA: Diagnosis not present

## 2022-09-17 DIAGNOSIS — Z1389 Encounter for screening for other disorder: Secondary | ICD-10-CM | POA: Diagnosis not present

## 2022-09-17 DIAGNOSIS — S22080A Wedge compression fracture of T11-T12 vertebra, initial encounter for closed fracture: Secondary | ICD-10-CM | POA: Diagnosis not present

## 2022-09-17 DIAGNOSIS — Z79891 Long term (current) use of opiate analgesic: Secondary | ICD-10-CM | POA: Diagnosis not present

## 2022-09-17 DIAGNOSIS — M5136 Other intervertebral disc degeneration, lumbar region: Secondary | ICD-10-CM | POA: Diagnosis not present

## 2022-09-17 DIAGNOSIS — M549 Dorsalgia, unspecified: Secondary | ICD-10-CM | POA: Diagnosis not present

## 2022-09-17 DIAGNOSIS — S32010A Wedge compression fracture of first lumbar vertebra, initial encounter for closed fracture: Secondary | ICD-10-CM | POA: Diagnosis not present

## 2022-09-17 DIAGNOSIS — M47816 Spondylosis without myelopathy or radiculopathy, lumbar region: Secondary | ICD-10-CM | POA: Diagnosis not present

## 2022-09-17 DIAGNOSIS — G894 Chronic pain syndrome: Secondary | ICD-10-CM | POA: Diagnosis not present

## 2022-09-17 DIAGNOSIS — M4302 Spondylolysis, cervical region: Secondary | ICD-10-CM | POA: Diagnosis not present

## 2022-09-17 DIAGNOSIS — C61 Malignant neoplasm of prostate: Secondary | ICD-10-CM | POA: Diagnosis not present

## 2022-09-25 DIAGNOSIS — G4733 Obstructive sleep apnea (adult) (pediatric): Secondary | ICD-10-CM | POA: Diagnosis not present

## 2022-09-25 DIAGNOSIS — E785 Hyperlipidemia, unspecified: Secondary | ICD-10-CM | POA: Diagnosis not present

## 2022-09-25 DIAGNOSIS — I110 Hypertensive heart disease with heart failure: Secondary | ICD-10-CM | POA: Diagnosis not present

## 2022-09-25 DIAGNOSIS — N400 Enlarged prostate without lower urinary tract symptoms: Secondary | ICD-10-CM | POA: Diagnosis not present

## 2022-09-25 DIAGNOSIS — F325 Major depressive disorder, single episode, in full remission: Secondary | ICD-10-CM | POA: Diagnosis not present

## 2022-09-25 DIAGNOSIS — Z8249 Family history of ischemic heart disease and other diseases of the circulatory system: Secondary | ICD-10-CM | POA: Diagnosis not present

## 2022-09-25 DIAGNOSIS — Z008 Encounter for other general examination: Secondary | ICD-10-CM | POA: Diagnosis not present

## 2022-09-25 DIAGNOSIS — I509 Heart failure, unspecified: Secondary | ICD-10-CM | POA: Diagnosis not present

## 2022-09-25 DIAGNOSIS — M810 Age-related osteoporosis without current pathological fracture: Secondary | ICD-10-CM | POA: Diagnosis not present

## 2022-09-25 DIAGNOSIS — L309 Dermatitis, unspecified: Secondary | ICD-10-CM | POA: Diagnosis not present

## 2022-09-25 DIAGNOSIS — J4489 Other specified chronic obstructive pulmonary disease: Secondary | ICD-10-CM | POA: Diagnosis not present

## 2022-09-25 DIAGNOSIS — J9610 Chronic respiratory failure, unspecified whether with hypoxia or hypercapnia: Secondary | ICD-10-CM | POA: Diagnosis not present

## 2022-09-25 DIAGNOSIS — M1990 Unspecified osteoarthritis, unspecified site: Secondary | ICD-10-CM | POA: Diagnosis not present

## 2022-09-28 DIAGNOSIS — J449 Chronic obstructive pulmonary disease, unspecified: Secondary | ICD-10-CM | POA: Diagnosis not present

## 2022-09-28 DIAGNOSIS — J9611 Chronic respiratory failure with hypoxia: Secondary | ICD-10-CM | POA: Diagnosis not present

## 2022-10-02 DIAGNOSIS — J449 Chronic obstructive pulmonary disease, unspecified: Secondary | ICD-10-CM | POA: Diagnosis not present

## 2022-10-02 DIAGNOSIS — J9611 Chronic respiratory failure with hypoxia: Secondary | ICD-10-CM | POA: Diagnosis not present

## 2022-10-16 DIAGNOSIS — Z1389 Encounter for screening for other disorder: Secondary | ICD-10-CM | POA: Diagnosis not present

## 2022-10-16 DIAGNOSIS — M5136 Other intervertebral disc degeneration, lumbar region: Secondary | ICD-10-CM | POA: Diagnosis not present

## 2022-10-16 DIAGNOSIS — M47816 Spondylosis without myelopathy or radiculopathy, lumbar region: Secondary | ICD-10-CM | POA: Diagnosis not present

## 2022-10-16 DIAGNOSIS — S22080A Wedge compression fracture of T11-T12 vertebra, initial encounter for closed fracture: Secondary | ICD-10-CM | POA: Diagnosis not present

## 2022-10-16 DIAGNOSIS — Z79891 Long term (current) use of opiate analgesic: Secondary | ICD-10-CM | POA: Diagnosis not present

## 2022-10-16 DIAGNOSIS — G894 Chronic pain syndrome: Secondary | ICD-10-CM | POA: Diagnosis not present

## 2022-10-16 DIAGNOSIS — S32010A Wedge compression fracture of first lumbar vertebra, initial encounter for closed fracture: Secondary | ICD-10-CM | POA: Diagnosis not present

## 2022-10-16 DIAGNOSIS — M4302 Spondylolysis, cervical region: Secondary | ICD-10-CM | POA: Diagnosis not present

## 2022-10-16 DIAGNOSIS — C61 Malignant neoplasm of prostate: Secondary | ICD-10-CM | POA: Diagnosis not present

## 2022-10-29 DIAGNOSIS — J9611 Chronic respiratory failure with hypoxia: Secondary | ICD-10-CM | POA: Diagnosis not present

## 2022-10-29 DIAGNOSIS — J449 Chronic obstructive pulmonary disease, unspecified: Secondary | ICD-10-CM | POA: Diagnosis not present

## 2022-11-01 DIAGNOSIS — G4733 Obstructive sleep apnea (adult) (pediatric): Secondary | ICD-10-CM | POA: Diagnosis not present

## 2022-11-02 DIAGNOSIS — J9611 Chronic respiratory failure with hypoxia: Secondary | ICD-10-CM | POA: Diagnosis not present

## 2022-11-02 DIAGNOSIS — J449 Chronic obstructive pulmonary disease, unspecified: Secondary | ICD-10-CM | POA: Diagnosis not present

## 2022-11-06 DIAGNOSIS — Z79899 Other long term (current) drug therapy: Secondary | ICD-10-CM | POA: Diagnosis not present

## 2022-11-06 DIAGNOSIS — J449 Chronic obstructive pulmonary disease, unspecified: Secondary | ICD-10-CM | POA: Diagnosis not present

## 2022-11-06 DIAGNOSIS — E7800 Pure hypercholesterolemia, unspecified: Secondary | ICD-10-CM | POA: Diagnosis not present

## 2022-11-06 DIAGNOSIS — I700 Atherosclerosis of aorta: Secondary | ICD-10-CM | POA: Diagnosis not present

## 2022-11-06 DIAGNOSIS — D649 Anemia, unspecified: Secondary | ICD-10-CM | POA: Diagnosis not present

## 2022-11-06 DIAGNOSIS — C61 Malignant neoplasm of prostate: Secondary | ICD-10-CM | POA: Diagnosis not present

## 2022-11-06 DIAGNOSIS — I739 Peripheral vascular disease, unspecified: Secondary | ICD-10-CM | POA: Diagnosis not present

## 2022-11-06 DIAGNOSIS — Z9181 History of falling: Secondary | ICD-10-CM | POA: Diagnosis not present

## 2022-11-06 DIAGNOSIS — J9611 Chronic respiratory failure with hypoxia: Secondary | ICD-10-CM | POA: Diagnosis not present

## 2022-11-06 DIAGNOSIS — F1120 Opioid dependence, uncomplicated: Secondary | ICD-10-CM | POA: Diagnosis not present

## 2022-11-06 DIAGNOSIS — Z139 Encounter for screening, unspecified: Secondary | ICD-10-CM | POA: Diagnosis not present

## 2022-11-13 DIAGNOSIS — M47816 Spondylosis without myelopathy or radiculopathy, lumbar region: Secondary | ICD-10-CM | POA: Diagnosis not present

## 2022-11-13 DIAGNOSIS — G894 Chronic pain syndrome: Secondary | ICD-10-CM | POA: Diagnosis not present

## 2022-11-13 DIAGNOSIS — M4302 Spondylolysis, cervical region: Secondary | ICD-10-CM | POA: Diagnosis not present

## 2022-11-13 DIAGNOSIS — Z1389 Encounter for screening for other disorder: Secondary | ICD-10-CM | POA: Diagnosis not present

## 2022-11-13 DIAGNOSIS — S32010A Wedge compression fracture of first lumbar vertebra, initial encounter for closed fracture: Secondary | ICD-10-CM | POA: Diagnosis not present

## 2022-11-13 DIAGNOSIS — S22080A Wedge compression fracture of T11-T12 vertebra, initial encounter for closed fracture: Secondary | ICD-10-CM | POA: Diagnosis not present

## 2022-11-13 DIAGNOSIS — C61 Malignant neoplasm of prostate: Secondary | ICD-10-CM | POA: Diagnosis not present

## 2022-11-13 DIAGNOSIS — M5136 Other intervertebral disc degeneration, lumbar region: Secondary | ICD-10-CM | POA: Diagnosis not present

## 2022-11-13 DIAGNOSIS — Z79891 Long term (current) use of opiate analgesic: Secondary | ICD-10-CM | POA: Diagnosis not present

## 2022-11-28 DIAGNOSIS — J449 Chronic obstructive pulmonary disease, unspecified: Secondary | ICD-10-CM | POA: Diagnosis not present

## 2022-11-28 DIAGNOSIS — J9611 Chronic respiratory failure with hypoxia: Secondary | ICD-10-CM | POA: Diagnosis not present

## 2022-12-01 DIAGNOSIS — G4733 Obstructive sleep apnea (adult) (pediatric): Secondary | ICD-10-CM | POA: Diagnosis not present

## 2022-12-02 DIAGNOSIS — J449 Chronic obstructive pulmonary disease, unspecified: Secondary | ICD-10-CM | POA: Diagnosis not present

## 2022-12-02 DIAGNOSIS — J9611 Chronic respiratory failure with hypoxia: Secondary | ICD-10-CM | POA: Diagnosis not present

## 2022-12-03 DIAGNOSIS — G4733 Obstructive sleep apnea (adult) (pediatric): Secondary | ICD-10-CM | POA: Diagnosis not present

## 2022-12-04 DIAGNOSIS — R351 Nocturia: Secondary | ICD-10-CM | POA: Diagnosis not present

## 2022-12-04 DIAGNOSIS — G4761 Periodic limb movement disorder: Secondary | ICD-10-CM | POA: Diagnosis not present

## 2022-12-04 DIAGNOSIS — J9612 Chronic respiratory failure with hypercapnia: Secondary | ICD-10-CM | POA: Diagnosis not present

## 2022-12-04 DIAGNOSIS — J9611 Chronic respiratory failure with hypoxia: Secondary | ICD-10-CM | POA: Diagnosis not present

## 2022-12-04 DIAGNOSIS — J449 Chronic obstructive pulmonary disease, unspecified: Secondary | ICD-10-CM | POA: Diagnosis not present

## 2022-12-04 DIAGNOSIS — G4733 Obstructive sleep apnea (adult) (pediatric): Secondary | ICD-10-CM | POA: Diagnosis not present

## 2022-12-04 DIAGNOSIS — C61 Malignant neoplasm of prostate: Secondary | ICD-10-CM | POA: Diagnosis not present

## 2022-12-04 DIAGNOSIS — I2510 Atherosclerotic heart disease of native coronary artery without angina pectoris: Secondary | ICD-10-CM | POA: Diagnosis not present

## 2022-12-04 DIAGNOSIS — F17210 Nicotine dependence, cigarettes, uncomplicated: Secondary | ICD-10-CM | POA: Diagnosis not present

## 2022-12-11 ENCOUNTER — Encounter: Payer: Self-pay | Admitting: Cardiology

## 2022-12-11 ENCOUNTER — Encounter: Payer: Self-pay | Admitting: *Deleted

## 2022-12-18 DIAGNOSIS — S22080A Wedge compression fracture of T11-T12 vertebra, initial encounter for closed fracture: Secondary | ICD-10-CM | POA: Diagnosis not present

## 2022-12-18 DIAGNOSIS — S32010A Wedge compression fracture of first lumbar vertebra, initial encounter for closed fracture: Secondary | ICD-10-CM | POA: Diagnosis not present

## 2022-12-18 DIAGNOSIS — Z79891 Long term (current) use of opiate analgesic: Secondary | ICD-10-CM | POA: Diagnosis not present

## 2022-12-18 DIAGNOSIS — M47816 Spondylosis without myelopathy or radiculopathy, lumbar region: Secondary | ICD-10-CM | POA: Diagnosis not present

## 2022-12-18 DIAGNOSIS — C61 Malignant neoplasm of prostate: Secondary | ICD-10-CM | POA: Diagnosis not present

## 2022-12-18 DIAGNOSIS — Z1389 Encounter for screening for other disorder: Secondary | ICD-10-CM | POA: Diagnosis not present

## 2022-12-18 DIAGNOSIS — M5136 Other intervertebral disc degeneration, lumbar region: Secondary | ICD-10-CM | POA: Diagnosis not present

## 2022-12-18 DIAGNOSIS — G894 Chronic pain syndrome: Secondary | ICD-10-CM | POA: Diagnosis not present

## 2022-12-18 DIAGNOSIS — M549 Dorsalgia, unspecified: Secondary | ICD-10-CM | POA: Diagnosis not present

## 2022-12-18 DIAGNOSIS — M4302 Spondylolysis, cervical region: Secondary | ICD-10-CM | POA: Diagnosis not present

## 2022-12-29 DIAGNOSIS — J449 Chronic obstructive pulmonary disease, unspecified: Secondary | ICD-10-CM | POA: Diagnosis not present

## 2022-12-29 DIAGNOSIS — J9611 Chronic respiratory failure with hypoxia: Secondary | ICD-10-CM | POA: Diagnosis not present

## 2023-01-01 DIAGNOSIS — G4733 Obstructive sleep apnea (adult) (pediatric): Secondary | ICD-10-CM | POA: Diagnosis not present

## 2023-01-02 DIAGNOSIS — J9611 Chronic respiratory failure with hypoxia: Secondary | ICD-10-CM | POA: Diagnosis not present

## 2023-01-02 DIAGNOSIS — J449 Chronic obstructive pulmonary disease, unspecified: Secondary | ICD-10-CM | POA: Diagnosis not present

## 2023-01-02 DIAGNOSIS — G4733 Obstructive sleep apnea (adult) (pediatric): Secondary | ICD-10-CM | POA: Diagnosis not present

## 2023-01-03 ENCOUNTER — Ambulatory Visit: Payer: Medicare HMO | Attending: Cardiology | Admitting: Cardiology

## 2023-01-03 ENCOUNTER — Encounter: Payer: Self-pay | Admitting: Cardiology

## 2023-01-03 VITALS — BP 142/70 | HR 79 | Ht 67.0 in | Wt 122.8 lb

## 2023-01-03 DIAGNOSIS — I739 Peripheral vascular disease, unspecified: Secondary | ICD-10-CM

## 2023-01-03 DIAGNOSIS — R0989 Other specified symptoms and signs involving the circulatory and respiratory systems: Secondary | ICD-10-CM

## 2023-01-03 DIAGNOSIS — I2510 Atherosclerotic heart disease of native coronary artery without angina pectoris: Secondary | ICD-10-CM

## 2023-01-03 DIAGNOSIS — R0609 Other forms of dyspnea: Secondary | ICD-10-CM | POA: Diagnosis not present

## 2023-01-03 DIAGNOSIS — F17200 Nicotine dependence, unspecified, uncomplicated: Secondary | ICD-10-CM | POA: Diagnosis not present

## 2023-01-03 DIAGNOSIS — I10 Essential (primary) hypertension: Secondary | ICD-10-CM | POA: Diagnosis not present

## 2023-01-03 DIAGNOSIS — E785 Hyperlipidemia, unspecified: Secondary | ICD-10-CM

## 2023-01-03 DIAGNOSIS — IMO0001 Reserved for inherently not codable concepts without codable children: Secondary | ICD-10-CM

## 2023-01-03 MED ORDER — ISOSORBIDE MONONITRATE ER 30 MG PO TB24: 30.0000 mg | ORAL_TABLET | Freq: Every day | ORAL | 3 refills | Status: DC

## 2023-01-03 NOTE — Progress Notes (Signed)
Cardiology Consultation:    Date:  01/03/2023   ID:  Corey Webb, DOB 12-Dec-1950, MRN [id number]  PCP:  Ailene Ravel, MD  Cardiologist:  Gypsy Balsam, MD   Referring MD: Marcellus Scott, MD   Chief Complaint  Patient presents with   Abnormal Stress test   Leg Swelling    On Lasix    History of Present Illness:    Corey Webb is a 72 y.o. male who is being seen today for the evaluation of abnormal stress test at the request of Chodri, Eual Fines, MD. past medical history significant for advanced COPD, dyslipidemia, essential hypertension, he did have a cardiac catheterization done about 10 years ago which showed nonobstructive disease.  Recently got CT of his chest done to follow-up his COPD he was identified to have calcification of the coronary arteries, after that stress test has been performed and stress test show small area of mild ischemia involving midportion of the inferior septal and anterior septal region.  He comes today to me to be established as a patient also talk about options for this situation.  He denies have any symptoms but his ability to exercise severely limited because of advanced COPD, chronic back problem as well as leg pain he does have pain in the left leg especially while he walks.  Denies have any chest pain tightness squeezing pressure burning chest but he also jokingly tells me that he hurts all over.  He still continues to smoke about 1 pack/day but he said is much better because he used to smoke 3 packs/day.  Does have dyslipidemia managed with pravastatin with good cholesterol profile now.  Also essential hypertension, but no diabetes.  Does have family history of premature coronary artery disease apparently his father started having problem with his heart before age of 72 he was a smoker as well  Past Medical History:  Diagnosis Date   Arthritis    DDD   CAD in native artery 05/24/2016   Cancer [facility])    COPD (chronic obstructive pulmonary  disease) (HCC)    Dyslipidemia 01/10/2017   Essential hypertension 01/10/2017   Hyperlipidemia    Myocardial infarction [facility])     Past Surgical History:  Procedure Laterality Date   BACK SURGERY     CATARACT EXTRACTION Bilateral    EYE SURGERY     HERNIA REPAIR Right 08/10/2019   [place]   NECK SURGERY     REFRACTIVE SURGERY Bilateral     Current Medications: Current Meds  Medication Sig   alendronate (FOSAMAX) 70 MG tablet Take 70 mg by mouth once a week.   bicalutamide (CASODEX) 50 MG tablet Take 50 mg by mouth daily.   fluticasone (FLONASE) 50 MCG/ACT nasal spray Place 1 spray into both nostrils 2 (two) times daily.   furosemide (LASIX) 20 MG tablet Take 20 mg by mouth daily.   ipratropium-albuterol (DUONEB) 0.5-2.5 (3) MG/3ML SOLN Take 3 mLs by nebulization every 6 (six) hours as needed (shortness of breath).   LORazepam (ATIVAN) 0.5 MG tablet Take 0.5 mg by mouth every 8 (eight) hours as needed for anxiety.   oxyCODONE (ROXICODONE) 15 MG immediate release tablet Take 15 mg by mouth every 8 (eight) hours as needed for pain.   pravastatin (PRAVACHOL) 40 MG tablet Take 40 mg by mouth daily.   tamsulosin (FLOMAX) 0.4 MG CAPS capsule TAKE 1 CAPSULE BY MOUTH 2 TIMES DAILY.   traZODone (DESYREL) 50 MG tablet Take 50 mg by mouth at bedtime.  TRELEGY ELLIPTA 100-62.5-25 MCG/ACT AEPB Inhale 1 puff into the lungs daily.   Current Facility-Administered Medications for the 01/03/23 encounter (Office Visit) with Georgeanna Lea, MD  Medication   triamcinolone acetonide (KENALOG) 10 MG/ML injection 10 mg     Allergies:   Atorvastatin, Clonazepam, Ropinirole, and Varenicline   Social History   Socioeconomic History   Marital status: Married    Spouse name: Not on file   Number of children: Not on file   Years of education: Not on file   Highest education level: Not on file  Occupational History   Not on file  Tobacco Use   Smoking status: Every Day    Types: Cigarettes    Smokeless tobacco: Never  Substance and Sexual Activity   Alcohol use: Never   Drug use: Never   Sexual activity: Not on file  Other Topics Concern   Not on file  Social History Narrative   Not on file   Social Determinants of Health   Financial Resource Strain: Not on file  Food Insecurity: Not on file  Transportation Needs: Not on file  Physical Activity: Not on file  Stress: Not on file  Social Connections: Not on file     Family History: The patient's ***family history includes Cancer in his mother; Heart attack in his father. ROS:   Please see the history of present illness.    All 14 point review of systems negative except as described per history of present illness.  EKGs/Labs/Other Studies Reviewed:    The following studies were reviewed today: ***  EKG:  EKG Interpretation Date/Time:  Thursday January 03 2023 10:52:50 EDT Ventricular Rate:  79 PR Interval:  164 QRS Duration:  84 QT Interval:  388 QTC Calculation: 444 R Axis:   66  Text Interpretation: Normal sinus rhythm Normal ECG When compared with ECG of 11-May-2005 13:53, ST no longer depressed in Inferior leads Non-specific change in ST segment in Anterior leads T wave inversion no longer evident in Anterior leads Confirmed by Gypsy Balsam [phone]) on 01/03/2023 10:57:49 AM    Recent Labs: No results found for requested labs within last 365 days.  Recent Lipid Panel No results found for: "CHOL", "TRIG", "HDL", "CHOLHDL", "VLDL", "LDLCALC", "LDLDIRECT"  Physical Exam:    VS:  BP (!) 142/70 (BP Location: Left Arm, Patient Position: Sitting)   Pulse 79   Ht 5\' 7"  (1.702 m)   Wt 122 lb 12.8 oz (55.7 kg)   SpO2 (!) 65%   BMI 19.23 kg/m     Wt Readings from Last 3 Encounters:  01/03/23 122 lb 12.8 oz (55.7 kg)  12/04/22 121 lb (54.9 kg)  04/04/22 136 lb (61.7 kg)     GEN:  Well nourished, well developed in no acute distress, cachectic appearing HEENT: Normal NECK: No JVD; left carotic  bruit noted LYMPHATICS: No lymphadenopathy CARDIAC: RRR, no murmurs, no rubs, no gallops, tones are distant RESPIRATORY: Already slight with a very poor air entry bilaterally with some wheezes ABDOMEN: Soft, non-tender, non-distended MUSCULOSKELETAL:  No edema; No deformity  SKIN: Warm and dry NEUROLOGIC:  Alert and oriented x 3 PSYCHIATRIC:  Normal affect   ASSESSMENT:    1. Essential hypertension   2. CAD in native artery   3. Left carotid bruit   4. Smoking   5. Dyslipidemia    PLAN:    In order of problems listed above:  Abnormal stress test showing mild area of ischemia involving midportion of the inferior septal  anterior septal region.  He is asymptomatic his limitation is advanced COPD as well as chronic back problem.  He is already on antiplatelet therapy form of aspirin which I will continue, I will put him on Imdur 30 mg daily.  To avoid ischemia.  I do not think is a good candidate for any interventional cardiac catheterization because of overall poor condition.  Will try to manage this problem medically. "Bruit, carotic ultrasounds will be done.  In the meantime continue antiplatelets and statin. Claudication lower extremities, left leg Very poor pulses weak pulse, this is a 1 to give him more trouble, on the right side pulses are good. Dyslipidemia continue present management I did review K PN which show me his LDL of 40 HDL 69 good cholesterol profile. Smoking I initiated conversation about need to quit he immediately told me that he is not interested which is sad because there is still something that can be safe.   Medication Adjustments/Labs and Tests Ordered: Current medicines are reviewed at length with the patient today.  Concerns regarding medicines are outlined above.  Orders Placed This Encounter  Procedures   EKG 12-Lead   No orders of the defined types were placed in this encounter.   Signed, Georgeanna Lea, MD, [facility]. 01/03/2023 11:17 AM    [facility]

## 2023-01-03 NOTE — Patient Instructions (Addendum)
Medication Instructions:   START: Imdur 30mg  1 tablet daily   Lab Work: None Ordered If you have labs (blood work) drawn today and your tests are completely normal, you will receive your results only by: MyChart Message (if you have MyChart) OR A paper copy in the mail If you have any lab test that is abnormal or we need to change your treatment, we will call you to review the results.   Testing/Procedures: Your physician has requested that you have a carotid duplex. This test is an ultrasound of the carotid arteries in your neck. It looks at blood flow through these arteries that supply the brain with blood. Allow one hour for this exam. There are no restrictions or special instructions.  Your physician has requested that you have an echocardiogram. Echocardiography is a painless test that uses sound waves to create images of your heart. It provides your doctor with information about the size and shape of your heart and how well your heart's chambers and valves are working. This procedure takes approximately one hour. There are no restrictions for this procedure. Please do NOT wear cologne, perfume, aftershave, or lotions (deodorant is allowed). Please arrive 15 minutes prior to your appointment time.  Your physician has requested that you have a lower or upper extremity arterial duplex. This test is an ultrasound of the arteries in the legs or arms. It looks at arterial blood flow in the legs and arms. Allow one hour for Lower and Upper Arterial scans. There are no restrictions or special instructions      Follow-Up: At [facility], you and your health needs are our priority.  As part of our continuing mission to provide you with exceptional heart care, we have created designated Provider Care Teams.  These Care Teams include your primary Cardiologist (physician) and Advanced Practice Providers (APPs -  Physician Assistants and Nurse Practitioners) who all work together to provide you  with the care you need, when you need it.  We recommend signing up for the patient portal called "MyChart".  Sign up information is provided on this After Visit Summary.  MyChart is used to connect with patients for Virtual Visits (Telemedicine).  Patients are able to view lab/test results, encounter notes, upcoming appointments, etc.  Non-urgent messages can be sent to your provider as well.   To learn more about what you can do with MyChart, go to [email or web address].    Your next appointment:   2 month(s)  The format for your next appointment:   In Person  Provider:   Gypsy Balsam, MD    Other Instructions NA

## 2023-01-15 DIAGNOSIS — I2510 Atherosclerotic heart disease of native coronary artery without angina pectoris: Secondary | ICD-10-CM | POA: Diagnosis not present

## 2023-01-15 DIAGNOSIS — M47816 Spondylosis without myelopathy or radiculopathy, lumbar region: Secondary | ICD-10-CM | POA: Diagnosis not present

## 2023-01-15 DIAGNOSIS — M4302 Spondylolysis, cervical region: Secondary | ICD-10-CM | POA: Diagnosis not present

## 2023-01-15 DIAGNOSIS — F17210 Nicotine dependence, cigarettes, uncomplicated: Secondary | ICD-10-CM | POA: Diagnosis not present

## 2023-01-15 DIAGNOSIS — M5136 Other intervertebral disc degeneration, lumbar region: Secondary | ICD-10-CM | POA: Diagnosis not present

## 2023-01-15 DIAGNOSIS — C61 Malignant neoplasm of prostate: Secondary | ICD-10-CM | POA: Diagnosis not present

## 2023-01-15 DIAGNOSIS — G894 Chronic pain syndrome: Secondary | ICD-10-CM | POA: Diagnosis not present

## 2023-01-15 DIAGNOSIS — M549 Dorsalgia, unspecified: Secondary | ICD-10-CM | POA: Diagnosis not present

## 2023-01-15 DIAGNOSIS — Z1389 Encounter for screening for other disorder: Secondary | ICD-10-CM | POA: Diagnosis not present

## 2023-01-15 DIAGNOSIS — S32010A Wedge compression fracture of first lumbar vertebra, initial encounter for closed fracture: Secondary | ICD-10-CM | POA: Diagnosis not present

## 2023-01-15 DIAGNOSIS — J9611 Chronic respiratory failure with hypoxia: Secondary | ICD-10-CM | POA: Diagnosis not present

## 2023-01-15 DIAGNOSIS — S22080A Wedge compression fracture of T11-T12 vertebra, initial encounter for closed fracture: Secondary | ICD-10-CM | POA: Diagnosis not present

## 2023-01-15 DIAGNOSIS — J9612 Chronic respiratory failure with hypercapnia: Secondary | ICD-10-CM | POA: Diagnosis not present

## 2023-01-15 DIAGNOSIS — J449 Chronic obstructive pulmonary disease, unspecified: Secondary | ICD-10-CM | POA: Diagnosis not present

## 2023-01-15 DIAGNOSIS — Z79891 Long term (current) use of opiate analgesic: Secondary | ICD-10-CM | POA: Diagnosis not present

## 2023-01-15 DIAGNOSIS — G4733 Obstructive sleep apnea (adult) (pediatric): Secondary | ICD-10-CM | POA: Diagnosis not present

## 2023-01-15 DIAGNOSIS — G4761 Periodic limb movement disorder: Secondary | ICD-10-CM | POA: Diagnosis not present

## 2023-01-16 DIAGNOSIS — C61 Malignant neoplasm of prostate: Secondary | ICD-10-CM | POA: Diagnosis not present

## 2023-01-16 DIAGNOSIS — R351 Nocturia: Secondary | ICD-10-CM | POA: Diagnosis not present

## 2023-01-29 DIAGNOSIS — J449 Chronic obstructive pulmonary disease, unspecified: Secondary | ICD-10-CM | POA: Diagnosis not present

## 2023-01-29 DIAGNOSIS — J9611 Chronic respiratory failure with hypoxia: Secondary | ICD-10-CM | POA: Diagnosis not present

## 2023-02-01 DIAGNOSIS — Z7983 Long term (current) use of bisphosphonates: Secondary | ICD-10-CM | POA: Diagnosis not present

## 2023-02-01 DIAGNOSIS — D72829 Elevated white blood cell count, unspecified: Secondary | ICD-10-CM | POA: Diagnosis not present

## 2023-02-01 DIAGNOSIS — M810 Age-related osteoporosis without current pathological fracture: Secondary | ICD-10-CM | POA: Diagnosis not present

## 2023-02-01 DIAGNOSIS — J449 Chronic obstructive pulmonary disease, unspecified: Secondary | ICD-10-CM | POA: Diagnosis not present

## 2023-02-01 DIAGNOSIS — R0600 Dyspnea, unspecified: Secondary | ICD-10-CM | POA: Diagnosis not present

## 2023-02-01 DIAGNOSIS — I700 Atherosclerosis of aorta: Secondary | ICD-10-CM | POA: Diagnosis not present

## 2023-02-01 DIAGNOSIS — R197 Diarrhea, unspecified: Secondary | ICD-10-CM | POA: Diagnosis not present

## 2023-02-01 DIAGNOSIS — I2510 Atherosclerotic heart disease of native coronary artery without angina pectoris: Secondary | ICD-10-CM | POA: Diagnosis not present

## 2023-02-01 DIAGNOSIS — K5100 Ulcerative (chronic) pancolitis without complications: Secondary | ICD-10-CM | POA: Diagnosis not present

## 2023-02-01 DIAGNOSIS — Z923 Personal history of irradiation: Secondary | ICD-10-CM | POA: Diagnosis not present

## 2023-02-01 DIAGNOSIS — Z1152 Encounter for screening for COVID-19: Secondary | ICD-10-CM | POA: Diagnosis not present

## 2023-02-01 DIAGNOSIS — F17210 Nicotine dependence, cigarettes, uncomplicated: Secondary | ICD-10-CM | POA: Diagnosis not present

## 2023-02-01 DIAGNOSIS — E785 Hyperlipidemia, unspecified: Secondary | ICD-10-CM | POA: Diagnosis not present

## 2023-02-01 DIAGNOSIS — I959 Hypotension, unspecified: Secondary | ICD-10-CM | POA: Diagnosis not present

## 2023-02-01 DIAGNOSIS — Z7982 Long term (current) use of aspirin: Secondary | ICD-10-CM | POA: Diagnosis not present

## 2023-02-01 DIAGNOSIS — R109 Unspecified abdominal pain: Secondary | ICD-10-CM | POA: Diagnosis not present

## 2023-02-01 DIAGNOSIS — K529 Noninfective gastroenteritis and colitis, unspecified: Secondary | ICD-10-CM | POA: Diagnosis not present

## 2023-02-02 DIAGNOSIS — F17210 Nicotine dependence, cigarettes, uncomplicated: Secondary | ICD-10-CM | POA: Diagnosis not present

## 2023-02-02 DIAGNOSIS — R6521 Severe sepsis with septic shock: Secondary | ICD-10-CM | POA: Diagnosis not present

## 2023-02-02 DIAGNOSIS — D72829 Elevated white blood cell count, unspecified: Secondary | ICD-10-CM | POA: Diagnosis not present

## 2023-02-02 DIAGNOSIS — R197 Diarrhea, unspecified: Secondary | ICD-10-CM | POA: Diagnosis not present

## 2023-02-02 DIAGNOSIS — I959 Hypotension, unspecified: Secondary | ICD-10-CM | POA: Diagnosis not present

## 2023-02-02 DIAGNOSIS — I2510 Atherosclerotic heart disease of native coronary artery without angina pectoris: Secondary | ICD-10-CM | POA: Diagnosis not present

## 2023-02-02 DIAGNOSIS — Z7982 Long term (current) use of aspirin: Secondary | ICD-10-CM | POA: Diagnosis not present

## 2023-02-02 DIAGNOSIS — R339 Retention of urine, unspecified: Secondary | ICD-10-CM | POA: Diagnosis not present

## 2023-02-02 DIAGNOSIS — I491 Atrial premature depolarization: Secondary | ICD-10-CM | POA: Diagnosis not present

## 2023-02-02 DIAGNOSIS — R0902 Hypoxemia: Secondary | ICD-10-CM | POA: Diagnosis not present

## 2023-02-02 DIAGNOSIS — Z79891 Long term (current) use of opiate analgesic: Secondary | ICD-10-CM | POA: Diagnosis not present

## 2023-02-02 DIAGNOSIS — R0600 Dyspnea, unspecified: Secondary | ICD-10-CM | POA: Diagnosis not present

## 2023-02-02 DIAGNOSIS — Z9981 Dependence on supplemental oxygen: Secondary | ICD-10-CM | POA: Diagnosis not present

## 2023-02-02 DIAGNOSIS — I70213 Atherosclerosis of native arteries of extremities with intermittent claudication, bilateral legs: Secondary | ICD-10-CM | POA: Diagnosis not present

## 2023-02-02 DIAGNOSIS — A0472 Enterocolitis due to Clostridium difficile, not specified as recurrent: Secondary | ICD-10-CM | POA: Diagnosis not present

## 2023-02-02 DIAGNOSIS — J9621 Acute and chronic respiratory failure with hypoxia: Secondary | ICD-10-CM | POA: Diagnosis not present

## 2023-02-02 DIAGNOSIS — K529 Noninfective gastroenteritis and colitis, unspecified: Secondary | ICD-10-CM | POA: Insufficient documentation

## 2023-02-02 DIAGNOSIS — I10 Essential (primary) hypertension: Secondary | ICD-10-CM | POA: Diagnosis not present

## 2023-02-02 DIAGNOSIS — M8008XD Age-related osteoporosis with current pathological fracture, vertebra(e), subsequent encounter for fracture with routine healing: Secondary | ICD-10-CM | POA: Diagnosis not present

## 2023-02-02 DIAGNOSIS — Z923 Personal history of irradiation: Secondary | ICD-10-CM | POA: Diagnosis not present

## 2023-02-02 DIAGNOSIS — N179 Acute kidney failure, unspecified: Secondary | ICD-10-CM | POA: Diagnosis not present

## 2023-02-02 DIAGNOSIS — I071 Rheumatic tricuspid insufficiency: Secondary | ICD-10-CM | POA: Diagnosis not present

## 2023-02-02 DIAGNOSIS — Z681 Body mass index (BMI) 19 or less, adult: Secondary | ICD-10-CM | POA: Diagnosis not present

## 2023-02-02 DIAGNOSIS — A419 Sepsis, unspecified organism: Secondary | ICD-10-CM | POA: Diagnosis not present

## 2023-02-02 DIAGNOSIS — Z7983 Long term (current) use of bisphosphonates: Secondary | ICD-10-CM | POA: Diagnosis not present

## 2023-02-02 DIAGNOSIS — R109 Unspecified abdominal pain: Secondary | ICD-10-CM | POA: Diagnosis not present

## 2023-02-02 DIAGNOSIS — K5100 Ulcerative (chronic) pancolitis without complications: Secondary | ICD-10-CM | POA: Diagnosis not present

## 2023-02-02 DIAGNOSIS — G4733 Obstructive sleep apnea (adult) (pediatric): Secondary | ICD-10-CM | POA: Diagnosis not present

## 2023-02-02 DIAGNOSIS — Z720 Tobacco use: Secondary | ICD-10-CM | POA: Diagnosis not present

## 2023-02-02 DIAGNOSIS — E871 Hypo-osmolality and hyponatremia: Secondary | ICD-10-CM | POA: Diagnosis not present

## 2023-02-02 DIAGNOSIS — Z66 Do not resuscitate: Secondary | ICD-10-CM | POA: Diagnosis not present

## 2023-02-02 DIAGNOSIS — M810 Age-related osteoporosis without current pathological fracture: Secondary | ICD-10-CM | POA: Diagnosis not present

## 2023-02-02 DIAGNOSIS — E785 Hyperlipidemia, unspecified: Secondary | ICD-10-CM | POA: Diagnosis not present

## 2023-02-02 DIAGNOSIS — G8929 Other chronic pain: Secondary | ICD-10-CM | POA: Diagnosis not present

## 2023-02-02 DIAGNOSIS — R579 Shock, unspecified: Secondary | ICD-10-CM | POA: Diagnosis not present

## 2023-02-02 DIAGNOSIS — J9611 Chronic respiratory failure with hypoxia: Secondary | ICD-10-CM | POA: Diagnosis not present

## 2023-02-02 DIAGNOSIS — R571 Hypovolemic shock: Secondary | ICD-10-CM | POA: Diagnosis not present

## 2023-02-02 DIAGNOSIS — Z8546 Personal history of malignant neoplasm of prostate: Secondary | ICD-10-CM | POA: Diagnosis not present

## 2023-02-02 DIAGNOSIS — J449 Chronic obstructive pulmonary disease, unspecified: Secondary | ICD-10-CM | POA: Diagnosis not present

## 2023-02-02 DIAGNOSIS — Z1152 Encounter for screening for COVID-19: Secondary | ICD-10-CM | POA: Diagnosis not present

## 2023-02-03 DIAGNOSIS — A0472 Enterocolitis due to Clostridium difficile, not specified as recurrent: Secondary | ICD-10-CM | POA: Insufficient documentation

## 2023-02-06 DIAGNOSIS — A0472 Enterocolitis due to Clostridium difficile, not specified as recurrent: Secondary | ICD-10-CM | POA: Diagnosis not present

## 2023-02-07 ENCOUNTER — Other Ambulatory Visit: Payer: Medicare HMO

## 2023-02-09 DIAGNOSIS — K5100 Ulcerative (chronic) pancolitis without complications: Secondary | ICD-10-CM | POA: Diagnosis not present

## 2023-02-11 ENCOUNTER — Ambulatory Visit: Payer: Medicare HMO | Attending: Cardiology

## 2023-02-11 ENCOUNTER — Ambulatory Visit (INDEPENDENT_AMBULATORY_CARE_PROVIDER_SITE_OTHER): Payer: Medicare HMO

## 2023-02-11 DIAGNOSIS — I739 Peripheral vascular disease, unspecified: Secondary | ICD-10-CM

## 2023-02-11 LAB — VAS US ABI WITH/WO TBI
Left ABI: 1.04
Right ABI: 1.14

## 2023-02-12 DIAGNOSIS — M549 Dorsalgia, unspecified: Secondary | ICD-10-CM | POA: Diagnosis not present

## 2023-02-12 DIAGNOSIS — M47816 Spondylosis without myelopathy or radiculopathy, lumbar region: Secondary | ICD-10-CM | POA: Diagnosis not present

## 2023-02-12 DIAGNOSIS — Z79891 Long term (current) use of opiate analgesic: Secondary | ICD-10-CM | POA: Diagnosis not present

## 2023-02-12 DIAGNOSIS — Z1389 Encounter for screening for other disorder: Secondary | ICD-10-CM | POA: Diagnosis not present

## 2023-02-12 DIAGNOSIS — S32010A Wedge compression fracture of first lumbar vertebra, initial encounter for closed fracture: Secondary | ICD-10-CM | POA: Diagnosis not present

## 2023-02-12 DIAGNOSIS — S22080A Wedge compression fracture of T11-T12 vertebra, initial encounter for closed fracture: Secondary | ICD-10-CM | POA: Diagnosis not present

## 2023-02-12 DIAGNOSIS — M7989 Other specified soft tissue disorders: Secondary | ICD-10-CM | POA: Diagnosis not present

## 2023-02-12 DIAGNOSIS — M4302 Spondylolysis, cervical region: Secondary | ICD-10-CM | POA: Diagnosis not present

## 2023-02-12 DIAGNOSIS — G894 Chronic pain syndrome: Secondary | ICD-10-CM | POA: Diagnosis not present

## 2023-02-12 DIAGNOSIS — M5136 Other intervertebral disc degeneration, lumbar region: Secondary | ICD-10-CM | POA: Diagnosis not present

## 2023-02-12 DIAGNOSIS — C61 Malignant neoplasm of prostate: Secondary | ICD-10-CM | POA: Diagnosis not present

## 2023-02-13 DIAGNOSIS — M79605 Pain in left leg: Secondary | ICD-10-CM | POA: Diagnosis not present

## 2023-02-13 DIAGNOSIS — M7122 Synovial cyst of popliteal space [Baker], left knee: Secondary | ICD-10-CM | POA: Diagnosis not present

## 2023-02-13 DIAGNOSIS — M7989 Other specified soft tissue disorders: Secondary | ICD-10-CM | POA: Diagnosis not present

## 2023-02-13 DIAGNOSIS — R600 Localized edema: Secondary | ICD-10-CM | POA: Diagnosis not present

## 2023-02-15 DIAGNOSIS — Z23 Encounter for immunization: Secondary | ICD-10-CM | POA: Diagnosis not present

## 2023-02-15 DIAGNOSIS — A0472 Enterocolitis due to Clostridium difficile, not specified as recurrent: Secondary | ICD-10-CM | POA: Diagnosis not present

## 2023-02-15 DIAGNOSIS — M810 Age-related osteoporosis without current pathological fracture: Secondary | ICD-10-CM | POA: Diagnosis not present

## 2023-02-15 DIAGNOSIS — Z7689 Persons encountering health services in other specified circumstances: Secondary | ICD-10-CM | POA: Diagnosis not present

## 2023-02-15 DIAGNOSIS — Z79899 Other long term (current) drug therapy: Secondary | ICD-10-CM | POA: Diagnosis not present

## 2023-02-18 ENCOUNTER — Telehealth: Payer: Self-pay

## 2023-02-18 NOTE — Telephone Encounter (Signed)
-----   Message from Gypsy Balsam sent at 02/13/2023 11:05 AM EDT ----- Lower extremities ultrasound did not show any significant stenosis

## 2023-02-18 NOTE — Telephone Encounter (Signed)
Spoke with [facility] of results and verbalized understanding

## 2023-02-19 ENCOUNTER — Telehealth: Payer: Self-pay

## 2023-02-19 NOTE — Telephone Encounter (Signed)
-----   Message from Gypsy Balsam sent at 02/13/2023 11:05 AM EDT ----- Normal ABIs bilaterally

## 2023-02-19 NOTE — Telephone Encounter (Addendum)
Spoke with Bonita Quin, notified of results

## 2023-02-28 ENCOUNTER — Ambulatory Visit: Payer: Medicare HMO

## 2023-02-28 ENCOUNTER — Ambulatory Visit: Payer: Medicare HMO | Attending: Cardiology

## 2023-02-28 DIAGNOSIS — R0989 Other specified symptoms and signs involving the circulatory and respiratory systems: Secondary | ICD-10-CM

## 2023-02-28 DIAGNOSIS — J9611 Chronic respiratory failure with hypoxia: Secondary | ICD-10-CM | POA: Diagnosis not present

## 2023-02-28 DIAGNOSIS — J449 Chronic obstructive pulmonary disease, unspecified: Secondary | ICD-10-CM | POA: Diagnosis not present

## 2023-02-28 NOTE — Progress Notes (Signed)
Patient left office before echocardiogram was performed. Tried to contact patient w/o success. No answering machine available.  Jimmy Britne Borelli RDCS, RVT

## 2023-03-04 DIAGNOSIS — J9611 Chronic respiratory failure with hypoxia: Secondary | ICD-10-CM | POA: Diagnosis not present

## 2023-03-04 DIAGNOSIS — J449 Chronic obstructive pulmonary disease, unspecified: Secondary | ICD-10-CM | POA: Diagnosis not present

## 2023-03-05 ENCOUNTER — Ambulatory Visit: Payer: Medicare HMO | Admitting: Cardiology

## 2023-03-06 ENCOUNTER — Telehealth: Payer: Self-pay

## 2023-03-06 ENCOUNTER — Ambulatory Visit: Payer: Medicare HMO | Attending: Cardiology

## 2023-03-06 ENCOUNTER — Ambulatory Visit: Payer: Medicare HMO | Attending: Cardiology | Admitting: Cardiology

## 2023-03-06 ENCOUNTER — Encounter: Payer: Self-pay | Admitting: Cardiology

## 2023-03-06 VITALS — BP 126/58 | HR 66 | Ht 67.0 in | Wt 112.8 lb

## 2023-03-06 DIAGNOSIS — E785 Hyperlipidemia, unspecified: Secondary | ICD-10-CM | POA: Diagnosis not present

## 2023-03-06 DIAGNOSIS — R0609 Other forms of dyspnea: Secondary | ICD-10-CM

## 2023-03-06 DIAGNOSIS — I10 Essential (primary) hypertension: Secondary | ICD-10-CM

## 2023-03-06 DIAGNOSIS — I2510 Atherosclerotic heart disease of native coronary artery without angina pectoris: Secondary | ICD-10-CM | POA: Diagnosis not present

## 2023-03-06 DIAGNOSIS — R0989 Other specified symptoms and signs involving the circulatory and respiratory systems: Secondary | ICD-10-CM | POA: Diagnosis not present

## 2023-03-06 NOTE — Patient Instructions (Signed)
Medication Instructions:  Your physician recommends that you continue on your current medications as directed. Please refer to the Current Medication list given to you today.  *If you need a refill on your cardiac medications before your next appointment, please call your pharmacy*   Lab Work: NONE If you have labs (blood work) drawn today and your tests are completely normal, you will receive your results only by: MyChart Message (if you have MyChart) OR A paper copy in the mail If you have any lab test that is abnormal or we need to change your treatment, we will call you to review the results.   Testing/Procedures: NONE   Follow-Up: At [facility], you and your health needs are our priority.  As part of our continuing mission to provide you with exceptional heart care, we have created designated Provider Care Teams.  These Care Teams include your primary Cardiologist (physician) and Advanced Practice Providers (APPs -  Physician Assistants and Nurse Practitioners) who all work together to provide you with the care you need, when you need it.  We recommend signing up for the patient portal called "MyChart".  Sign up information is provided on this After Visit Summary.  MyChart is used to connect with patients for Virtual Visits (Telemedicine).  Patients are able to view lab/test results, encounter notes, upcoming appointments, etc.  Non-urgent messages can be sent to your provider as well.   To learn more about what you can do with MyChart, go to [email or web address].    Your next appointment:   6 month(s)  Provider:   Gypsy Balsam, MD    Other Instructions

## 2023-03-06 NOTE — Progress Notes (Signed)
Cardiology Office Note:    Date:  03/06/2023   ID:  Corey Webb, DOB 1951/04/05, MRN [id number]  PCP:  Ailene Ravel, MD  Cardiologist:  Gypsy Balsam, MD    Referring MD: Ailene Ravel, MD   Chief Complaint  Patient presents with   Follow-up    History of Present Illness:    Corey Webb is a 72 y.o. male with past medical history significant for COPD, he still continues to smoke, he is O2 dependent, dyslipidemia, essential hypertension, cardiac catheterization done 10 years ago showing an obstructive lesion recently CT of the chest has been done to follow-up on his COPD he was identified to have calcification of the coronary after that stress test has been performed which showed small area of ischemia involving mid portion of the inferior septal wall.  He comes today to follow-up.  He denies have any chest pain tightness squeezing pressure burning chest.  He lives very sedentary lifestyle and that is because of his COPD.  Past Medical History:  Diagnosis Date   Arthritis    DDD   CAD in native artery 05/24/2016   Cancer [facility])    COPD (chronic obstructive pulmonary disease) (HCC)    Dyslipidemia 01/10/2017   Essential hypertension 01/10/2017   Hyperlipidemia    Myocardial infarction [facility])     Past Surgical History:  Procedure Laterality Date   BACK SURGERY     CATARACT EXTRACTION Bilateral    EYE SURGERY     HERNIA REPAIR Right 08/10/2019   Inquinal   NECK SURGERY     REFRACTIVE SURGERY Bilateral     Current Medications: Current Meds  Medication Sig   alendronate (FOSAMAX) 70 MG tablet Take 70 mg by mouth once a week.   bicalutamide (CASODEX) 50 MG tablet Take 50 mg by mouth daily.   fluticasone (FLONASE) 50 MCG/ACT nasal spray Place 1 spray into both nostrils 2 (two) times daily.   furosemide (LASIX) 20 MG tablet Take 20 mg by mouth daily.   ipratropium-albuterol (DUONEB) 0.5-2.5 (3) MG/3ML SOLN Take 3 mLs by nebulization every 6 (six) hours as  needed (shortness of breath).   isosorbide mononitrate (IMDUR) 30 MG 24 hr tablet Take 1 tablet (30 mg total) by mouth daily.   LORazepam (ATIVAN) 0.5 MG tablet Take 0.5 mg by mouth every 8 (eight) hours as needed for anxiety.   oxyCODONE (ROXICODONE) 15 MG immediate release tablet Take 15 mg by mouth every 8 (eight) hours as needed for pain.   pravastatin (PRAVACHOL) 40 MG tablet Take 40 mg by mouth daily.   tamsulosin (FLOMAX) 0.4 MG CAPS capsule TAKE 1 CAPSULE BY MOUTH 2 TIMES DAILY.   traZODone (DESYREL) 50 MG tablet Take 50 mg by mouth at bedtime.   TRELEGY ELLIPTA 100-62.5-25 MCG/ACT AEPB Inhale 1 puff into the lungs daily.   verapamil (CALAN-SR) 180 MG CR tablet Take 180 mg by mouth at bedtime.   Current Facility-Administered Medications for the 03/06/23 encounter (Office Visit) with Georgeanna Lea, MD  Medication   triamcinolone acetonide (KENALOG) 10 MG/ML injection 10 mg     Allergies:   Atorvastatin, Clonazepam, Ropinirole, and Varenicline   Social History   Socioeconomic History   Marital status: Married    Spouse name: Not on file   Number of children: Not on file   Years of education: Not on file   Highest education level: Not on file  Occupational History   Not on file  Tobacco Use  Smoking status: Every Day    Types: Cigarettes   Smokeless tobacco: Never  Substance and Sexual Activity   Alcohol use: Never   Drug use: Never   Sexual activity: Not on file  Other Topics Concern   Not on file  Social History Narrative   Not on file   Social Determinants of Health   Financial Resource Strain: Low Risk  (02/04/2023)   Received from [facility]   Overall Financial Resource Strain (CARDIA)    Difficulty of Paying Living Expenses: Not hard at all  Food Insecurity: No Food Insecurity (02/04/2023)   Received from [facility]   Hunger Vital Sign    Worried About Running Out of Food in the Last Year: Never true    Ran Out of Food in the Last Year:  Never true  Transportation Needs: No Transportation Needs (02/04/2023)   Received [facility]   PRAPARE - Transportation    Lack of Transportation (Medical): No    Lack of Transportation (Non-Medical): No  Physical Activity: Not on file  Stress: Not on file  Social Connections: Not on file     Family History: The patient's family history includes Cancer in his mother; Heart attack in his father. ROS:   Please see the history of present illness.    All 14 point review of systems negative except as described per history of present illness  EKGs/Labs/Other Studies Reviewed:         Recent Labs: No results found for requested labs within last 365 days.  Recent Lipid Panel No results found for: "CHOL", "TRIG", "HDL", "CHOLHDL", "VLDL", "LDLCALC", "LDLDIRECT"  Physical Exam:    VS:  BP (!) 126/58 (BP Location: Right Arm, Patient Position: Sitting)   Pulse 66   Ht 5\' 7"  (1.702 m)   Wt 112 lb 12.8 oz (51.2 kg)   SpO2 92%   BMI 17.67 kg/m     Wt Readings from Last 3 Encounters:  03/06/23 112 lb 12.8 oz (51.2 kg)  01/03/23 122 lb 12.8 oz (55.7 kg)  12/04/22 121 lb (54.9 kg)     GEN:  Well nourished, well developed in no acute distress HEENT: Normal NECK: No JVD;  carotid bruits present LYMPHATICS: No lymphadenopathy CARDIAC: RRR, no murmurs, no rubs, no gallops RESPIRATORY:  Clear to auscultation without rales, wheezing or rhonchi poor air entry bilaterally ABDOMEN: Soft, non-tender, non-distended MUSCULOSKELETAL:  No edema; No deformity  SKIN: Warm and dry LOWER EXTREMITIES: no swelling NEUROLOGIC:  Alert and oriented x 3 PSYCHIATRIC:  Normal affect   ASSESSMENT:    1. CAD in native artery   2. Essential hypertension   3. Left carotid bruit   4. Dyslipidemia    PLAN:    In order of problems listed above:  Coronary disease asymptomatic on antiplatelet therapy which I will continue. 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  Continue monitoring This Edema on pravastatin I did review K PN which show me his LDL 40 HDL 69 we will continue present management. COPD advanced followed by pulmonologist. I did review carotic ultrasounds as well as lower extremities ultrasound which did not show any critical stenosis   Medication Adjustments/Labs and Tests Ordered: Current medicines are reviewed at length with the patient today.  Concerns regarding medicines are outlined above.  No orders of the defined types were placed in this encounter.  Medication changes: No orders of the defined types  were placed in this encounter.   Signed, Georgeanna Lea, MD, [facility] 03/06/2023 1:21 PM    [facility]

## 2023-03-06 NOTE — Telephone Encounter (Signed)
-----   Message from Gypsy Balsam sent at 03/06/2023 11:07 AM EDT ----- Carotic artery up to 59% stenosis on the left side, only medical therapy

## 2023-03-06 NOTE — Telephone Encounter (Signed)
Unable to reach the patient, remote access code needed.

## 2023-03-07 LAB — ECHOCARDIOGRAM COMPLETE: S' Lateral: 2.5 cm

## 2023-03-13 ENCOUNTER — Telehealth: Payer: Self-pay

## 2023-03-13 NOTE — Telephone Encounter (Signed)
-----   Message from Gypsy Balsam sent at 03/11/2023 12:22 PM EDT ----- Echocardiogram showed preserved left ventricular ejection fraction, mild mitral valve regurgitation overall looks good

## 2023-03-13 NOTE — Telephone Encounter (Signed)
Spoke to [facility]reys notified of results

## 2023-03-13 NOTE — Telephone Encounter (Signed)
-----   Message from Gypsy Balsam sent at 03/06/2023 11:07 AM EDT ----- Carotic artery up to 59% stenosis on the left side, only medical therapy

## 2023-03-13 NOTE — Telephone Encounter (Signed)
Spoke with Bonita Quin notified of results

## 2023-03-18 DIAGNOSIS — R351 Nocturia: Secondary | ICD-10-CM | POA: Diagnosis not present

## 2023-03-18 DIAGNOSIS — C61 Malignant neoplasm of prostate: Secondary | ICD-10-CM | POA: Diagnosis not present

## 2023-03-19 DIAGNOSIS — Z1389 Encounter for screening for other disorder: Secondary | ICD-10-CM | POA: Diagnosis not present

## 2023-03-19 DIAGNOSIS — S22080A Wedge compression fracture of T11-T12 vertebra, initial encounter for closed fracture: Secondary | ICD-10-CM | POA: Diagnosis not present

## 2023-03-19 DIAGNOSIS — S32010A Wedge compression fracture of first lumbar vertebra, initial encounter for closed fracture: Secondary | ICD-10-CM | POA: Diagnosis not present

## 2023-03-19 DIAGNOSIS — Z79891 Long term (current) use of opiate analgesic: Secondary | ICD-10-CM | POA: Diagnosis not present

## 2023-03-19 DIAGNOSIS — M47816 Spondylosis without myelopathy or radiculopathy, lumbar region: Secondary | ICD-10-CM | POA: Diagnosis not present

## 2023-03-19 DIAGNOSIS — C61 Malignant neoplasm of prostate: Secondary | ICD-10-CM | POA: Diagnosis not present

## 2023-03-19 DIAGNOSIS — M4302 Spondylolysis, cervical region: Secondary | ICD-10-CM | POA: Diagnosis not present

## 2023-03-19 DIAGNOSIS — G894 Chronic pain syndrome: Secondary | ICD-10-CM | POA: Diagnosis not present

## 2023-03-22 DIAGNOSIS — R197 Diarrhea, unspecified: Secondary | ICD-10-CM | POA: Diagnosis not present

## 2023-03-22 DIAGNOSIS — R634 Abnormal weight loss: Secondary | ICD-10-CM | POA: Diagnosis not present

## 2023-03-22 DIAGNOSIS — R1310 Dysphagia, unspecified: Secondary | ICD-10-CM | POA: Diagnosis not present

## 2023-03-25 DIAGNOSIS — R197 Diarrhea, unspecified: Secondary | ICD-10-CM | POA: Diagnosis not present

## 2023-03-31 DIAGNOSIS — J9611 Chronic respiratory failure with hypoxia: Secondary | ICD-10-CM | POA: Diagnosis not present

## 2023-03-31 DIAGNOSIS — J449 Chronic obstructive pulmonary disease, unspecified: Secondary | ICD-10-CM | POA: Diagnosis not present

## 2023-04-04 DIAGNOSIS — J9611 Chronic respiratory failure with hypoxia: Secondary | ICD-10-CM | POA: Diagnosis not present

## 2023-04-04 DIAGNOSIS — J449 Chronic obstructive pulmonary disease, unspecified: Secondary | ICD-10-CM | POA: Diagnosis not present

## 2023-04-29 DIAGNOSIS — C61 Malignant neoplasm of prostate: Secondary | ICD-10-CM | POA: Diagnosis not present

## 2023-04-29 DIAGNOSIS — Z79891 Long term (current) use of opiate analgesic: Secondary | ICD-10-CM | POA: Diagnosis not present

## 2023-04-29 DIAGNOSIS — S32010A Wedge compression fracture of first lumbar vertebra, initial encounter for closed fracture: Secondary | ICD-10-CM | POA: Diagnosis not present

## 2023-04-29 DIAGNOSIS — M549 Dorsalgia, unspecified: Secondary | ICD-10-CM | POA: Diagnosis not present

## 2023-04-29 DIAGNOSIS — M47816 Spondylosis without myelopathy or radiculopathy, lumbar region: Secondary | ICD-10-CM | POA: Diagnosis not present

## 2023-04-29 DIAGNOSIS — Z1389 Encounter for screening for other disorder: Secondary | ICD-10-CM | POA: Diagnosis not present

## 2023-04-29 DIAGNOSIS — G894 Chronic pain syndrome: Secondary | ICD-10-CM | POA: Diagnosis not present

## 2023-04-29 DIAGNOSIS — M4302 Spondylolysis, cervical region: Secondary | ICD-10-CM | POA: Diagnosis not present

## 2023-04-29 DIAGNOSIS — S22080A Wedge compression fracture of T11-T12 vertebra, initial encounter for closed fracture: Secondary | ICD-10-CM | POA: Diagnosis not present

## 2023-04-30 DIAGNOSIS — J449 Chronic obstructive pulmonary disease, unspecified: Secondary | ICD-10-CM | POA: Diagnosis not present

## 2023-04-30 DIAGNOSIS — J9611 Chronic respiratory failure with hypoxia: Secondary | ICD-10-CM | POA: Diagnosis not present

## 2023-05-04 DIAGNOSIS — J9611 Chronic respiratory failure with hypoxia: Secondary | ICD-10-CM | POA: Diagnosis not present

## 2023-05-04 DIAGNOSIS — J449 Chronic obstructive pulmonary disease, unspecified: Secondary | ICD-10-CM | POA: Diagnosis not present

## 2023-05-08 DIAGNOSIS — A0471 Enterocolitis due to Clostridium difficile, recurrent: Secondary | ICD-10-CM | POA: Diagnosis not present

## 2023-05-08 DIAGNOSIS — M810 Age-related osteoporosis without current pathological fracture: Secondary | ICD-10-CM | POA: Diagnosis not present

## 2023-05-08 DIAGNOSIS — I700 Atherosclerosis of aorta: Secondary | ICD-10-CM | POA: Diagnosis not present

## 2023-05-08 DIAGNOSIS — J9611 Chronic respiratory failure with hypoxia: Secondary | ICD-10-CM | POA: Diagnosis not present

## 2023-05-08 DIAGNOSIS — F1120 Opioid dependence, uncomplicated: Secondary | ICD-10-CM | POA: Diagnosis not present

## 2023-05-08 DIAGNOSIS — Z23 Encounter for immunization: Secondary | ICD-10-CM | POA: Diagnosis not present

## 2023-05-08 DIAGNOSIS — C61 Malignant neoplasm of prostate: Secondary | ICD-10-CM | POA: Diagnosis not present

## 2023-05-08 DIAGNOSIS — Z79899 Other long term (current) drug therapy: Secondary | ICD-10-CM | POA: Diagnosis not present

## 2023-05-08 DIAGNOSIS — E7800 Pure hypercholesterolemia, unspecified: Secondary | ICD-10-CM | POA: Diagnosis not present

## 2023-05-08 DIAGNOSIS — I739 Peripheral vascular disease, unspecified: Secondary | ICD-10-CM | POA: Diagnosis not present

## 2023-05-08 DIAGNOSIS — D649 Anemia, unspecified: Secondary | ICD-10-CM | POA: Diagnosis not present

## 2023-05-08 DIAGNOSIS — J449 Chronic obstructive pulmonary disease, unspecified: Secondary | ICD-10-CM | POA: Diagnosis not present

## 2023-05-30 DIAGNOSIS — Z122 Encounter for screening for malignant neoplasm of respiratory organs: Secondary | ICD-10-CM | POA: Diagnosis not present

## 2023-05-30 DIAGNOSIS — Z87891 Personal history of nicotine dependence: Secondary | ICD-10-CM | POA: Diagnosis not present

## 2023-05-31 DIAGNOSIS — J449 Chronic obstructive pulmonary disease, unspecified: Secondary | ICD-10-CM | POA: Diagnosis not present

## 2023-05-31 DIAGNOSIS — J9611 Chronic respiratory failure with hypoxia: Secondary | ICD-10-CM | POA: Diagnosis not present

## 2023-06-03 DIAGNOSIS — G894 Chronic pain syndrome: Secondary | ICD-10-CM | POA: Diagnosis not present

## 2023-06-03 DIAGNOSIS — M47816 Spondylosis without myelopathy or radiculopathy, lumbar region: Secondary | ICD-10-CM | POA: Diagnosis not present

## 2023-06-03 DIAGNOSIS — M4302 Spondylolysis, cervical region: Secondary | ICD-10-CM | POA: Diagnosis not present

## 2023-06-03 DIAGNOSIS — C61 Malignant neoplasm of prostate: Secondary | ICD-10-CM | POA: Diagnosis not present

## 2023-06-03 DIAGNOSIS — S32010A Wedge compression fracture of first lumbar vertebra, initial encounter for closed fracture: Secondary | ICD-10-CM | POA: Diagnosis not present

## 2023-06-03 DIAGNOSIS — Z79891 Long term (current) use of opiate analgesic: Secondary | ICD-10-CM | POA: Diagnosis not present

## 2023-06-03 DIAGNOSIS — S22080A Wedge compression fracture of T11-T12 vertebra, initial encounter for closed fracture: Secondary | ICD-10-CM | POA: Diagnosis not present

## 2023-06-03 DIAGNOSIS — Z1389 Encounter for screening for other disorder: Secondary | ICD-10-CM | POA: Diagnosis not present

## 2023-06-04 DIAGNOSIS — J9611 Chronic respiratory failure with hypoxia: Secondary | ICD-10-CM | POA: Diagnosis not present

## 2023-06-04 DIAGNOSIS — J449 Chronic obstructive pulmonary disease, unspecified: Secondary | ICD-10-CM | POA: Diagnosis not present

## 2023-06-05 DIAGNOSIS — R634 Abnormal weight loss: Secondary | ICD-10-CM | POA: Diagnosis not present

## 2023-06-05 DIAGNOSIS — R911 Solitary pulmonary nodule: Secondary | ICD-10-CM | POA: Diagnosis not present

## 2023-06-06 DIAGNOSIS — R634 Abnormal weight loss: Secondary | ICD-10-CM | POA: Diagnosis not present

## 2023-06-17 DIAGNOSIS — R634 Abnormal weight loss: Secondary | ICD-10-CM | POA: Diagnosis not present

## 2023-06-17 DIAGNOSIS — J449 Chronic obstructive pulmonary disease, unspecified: Secondary | ICD-10-CM | POA: Diagnosis not present

## 2023-06-17 DIAGNOSIS — R1310 Dysphagia, unspecified: Secondary | ICD-10-CM | POA: Diagnosis not present

## 2023-06-22 DIAGNOSIS — Z7983 Long term (current) use of bisphosphonates: Secondary | ICD-10-CM | POA: Diagnosis not present

## 2023-06-22 DIAGNOSIS — N400 Enlarged prostate without lower urinary tract symptoms: Secondary | ICD-10-CM | POA: Diagnosis not present

## 2023-06-22 DIAGNOSIS — R627 Adult failure to thrive: Secondary | ICD-10-CM | POA: Diagnosis not present

## 2023-06-22 DIAGNOSIS — I252 Old myocardial infarction: Secondary | ICD-10-CM | POA: Insufficient documentation

## 2023-06-22 DIAGNOSIS — J1289 Other viral pneumonia: Secondary | ICD-10-CM | POA: Diagnosis not present

## 2023-06-22 DIAGNOSIS — E7800 Pure hypercholesterolemia, unspecified: Secondary | ICD-10-CM | POA: Insufficient documentation

## 2023-06-22 DIAGNOSIS — B9729 Other coronavirus as the cause of diseases classified elsewhere: Secondary | ICD-10-CM | POA: Diagnosis not present

## 2023-06-22 DIAGNOSIS — J189 Pneumonia, unspecified organism: Secondary | ICD-10-CM | POA: Diagnosis not present

## 2023-06-22 DIAGNOSIS — Z7982 Long term (current) use of aspirin: Secondary | ICD-10-CM | POA: Diagnosis not present

## 2023-06-22 DIAGNOSIS — J440 Chronic obstructive pulmonary disease with acute lower respiratory infection: Secondary | ICD-10-CM | POA: Diagnosis not present

## 2023-06-22 DIAGNOSIS — R918 Other nonspecific abnormal finding of lung field: Secondary | ICD-10-CM | POA: Diagnosis not present

## 2023-06-22 DIAGNOSIS — K219 Gastro-esophageal reflux disease without esophagitis: Secondary | ICD-10-CM | POA: Diagnosis not present

## 2023-06-22 DIAGNOSIS — R531 Weakness: Secondary | ICD-10-CM | POA: Diagnosis not present

## 2023-06-22 DIAGNOSIS — Z9221 Personal history of antineoplastic chemotherapy: Secondary | ICD-10-CM | POA: Diagnosis not present

## 2023-06-22 DIAGNOSIS — Z1152 Encounter for screening for COVID-19: Secondary | ICD-10-CM | POA: Diagnosis not present

## 2023-06-22 DIAGNOSIS — I10 Essential (primary) hypertension: Secondary | ICD-10-CM | POA: Diagnosis not present

## 2023-06-22 DIAGNOSIS — Z66 Do not resuscitate: Secondary | ICD-10-CM | POA: Diagnosis not present

## 2023-06-22 DIAGNOSIS — I2510 Atherosclerotic heart disease of native coronary artery without angina pectoris: Secondary | ICD-10-CM | POA: Diagnosis not present

## 2023-06-22 DIAGNOSIS — B342 Coronavirus infection, unspecified: Secondary | ICD-10-CM | POA: Diagnosis not present

## 2023-06-22 DIAGNOSIS — R0600 Dyspnea, unspecified: Secondary | ICD-10-CM | POA: Diagnosis not present

## 2023-06-22 DIAGNOSIS — R296 Repeated falls: Secondary | ICD-10-CM | POA: Diagnosis not present

## 2023-06-22 DIAGNOSIS — F17210 Nicotine dependence, cigarettes, uncomplicated: Secondary | ICD-10-CM | POA: Diagnosis not present

## 2023-06-22 DIAGNOSIS — J449 Chronic obstructive pulmonary disease, unspecified: Secondary | ICD-10-CM | POA: Insufficient documentation

## 2023-06-22 DIAGNOSIS — F419 Anxiety disorder, unspecified: Secondary | ICD-10-CM | POA: Diagnosis not present

## 2023-06-22 DIAGNOSIS — R410 Disorientation, unspecified: Secondary | ICD-10-CM | POA: Diagnosis not present

## 2023-06-22 DIAGNOSIS — Z8546 Personal history of malignant neoplasm of prostate: Secondary | ICD-10-CM | POA: Diagnosis not present

## 2023-06-22 DIAGNOSIS — R509 Fever, unspecified: Secondary | ICD-10-CM | POA: Diagnosis not present

## 2023-06-22 DIAGNOSIS — I959 Hypotension, unspecified: Secondary | ICD-10-CM | POA: Diagnosis not present

## 2023-06-22 DIAGNOSIS — C61 Malignant neoplasm of prostate: Secondary | ICD-10-CM | POA: Diagnosis not present

## 2023-06-23 DIAGNOSIS — Z7982 Long term (current) use of aspirin: Secondary | ICD-10-CM | POA: Diagnosis not present

## 2023-06-23 DIAGNOSIS — I2510 Atherosclerotic heart disease of native coronary artery without angina pectoris: Secondary | ICD-10-CM | POA: Diagnosis not present

## 2023-06-23 DIAGNOSIS — R296 Repeated falls: Secondary | ICD-10-CM | POA: Diagnosis not present

## 2023-06-23 DIAGNOSIS — J1289 Other viral pneumonia: Secondary | ICD-10-CM | POA: Diagnosis not present

## 2023-06-23 DIAGNOSIS — Z9221 Personal history of antineoplastic chemotherapy: Secondary | ICD-10-CM | POA: Diagnosis not present

## 2023-06-23 DIAGNOSIS — R627 Adult failure to thrive: Secondary | ICD-10-CM | POA: Insufficient documentation

## 2023-06-23 DIAGNOSIS — B9729 Other coronavirus as the cause of diseases classified elsewhere: Secondary | ICD-10-CM | POA: Diagnosis not present

## 2023-06-23 DIAGNOSIS — K219 Gastro-esophageal reflux disease without esophagitis: Secondary | ICD-10-CM | POA: Diagnosis not present

## 2023-06-23 DIAGNOSIS — F17210 Nicotine dependence, cigarettes, uncomplicated: Secondary | ICD-10-CM | POA: Diagnosis not present

## 2023-06-23 DIAGNOSIS — N400 Enlarged prostate without lower urinary tract symptoms: Secondary | ICD-10-CM | POA: Diagnosis not present

## 2023-06-23 DIAGNOSIS — J440 Chronic obstructive pulmonary disease with acute lower respiratory infection: Secondary | ICD-10-CM | POA: Diagnosis not present

## 2023-06-23 DIAGNOSIS — F419 Anxiety disorder, unspecified: Secondary | ICD-10-CM | POA: Diagnosis not present

## 2023-06-23 DIAGNOSIS — C61 Malignant neoplasm of prostate: Secondary | ICD-10-CM | POA: Diagnosis not present

## 2023-06-23 DIAGNOSIS — J189 Pneumonia, unspecified organism: Secondary | ICD-10-CM | POA: Diagnosis not present

## 2023-06-23 DIAGNOSIS — Z7983 Long term (current) use of bisphosphonates: Secondary | ICD-10-CM | POA: Diagnosis not present

## 2023-06-23 DIAGNOSIS — E7800 Pure hypercholesterolemia, unspecified: Secondary | ICD-10-CM | POA: Diagnosis not present

## 2023-06-23 DIAGNOSIS — I10 Essential (primary) hypertension: Secondary | ICD-10-CM | POA: Diagnosis not present

## 2023-06-23 DIAGNOSIS — Z1152 Encounter for screening for COVID-19: Secondary | ICD-10-CM | POA: Diagnosis not present

## 2023-06-23 DIAGNOSIS — Z66 Do not resuscitate: Secondary | ICD-10-CM | POA: Diagnosis not present

## 2023-06-25 DIAGNOSIS — R609 Edema, unspecified: Secondary | ICD-10-CM | POA: Diagnosis not present

## 2023-06-25 DIAGNOSIS — Z79899 Other long term (current) drug therapy: Secondary | ICD-10-CM | POA: Diagnosis not present

## 2023-06-25 DIAGNOSIS — T2020XA Burn of second degree of head, face, and neck, unspecified site, initial encounter: Secondary | ICD-10-CM | POA: Diagnosis not present

## 2023-06-25 DIAGNOSIS — I2510 Atherosclerotic heart disease of native coronary artery without angina pectoris: Secondary | ICD-10-CM | POA: Diagnosis not present

## 2023-06-25 DIAGNOSIS — Z7982 Long term (current) use of aspirin: Secondary | ICD-10-CM | POA: Diagnosis not present

## 2023-06-25 DIAGNOSIS — X04XXXA Exposure to ignition of highly flammable material, initial encounter: Secondary | ICD-10-CM | POA: Diagnosis not present

## 2023-06-25 DIAGNOSIS — J449 Chronic obstructive pulmonary disease, unspecified: Secondary | ICD-10-CM | POA: Diagnosis not present

## 2023-06-25 DIAGNOSIS — Z9981 Dependence on supplemental oxygen: Secondary | ICD-10-CM | POA: Diagnosis not present

## 2023-06-25 DIAGNOSIS — E785 Hyperlipidemia, unspecified: Secondary | ICD-10-CM | POA: Diagnosis not present

## 2023-06-25 DIAGNOSIS — R220 Localized swelling, mass and lump, head: Secondary | ICD-10-CM | POA: Diagnosis not present

## 2023-06-25 DIAGNOSIS — F17210 Nicotine dependence, cigarettes, uncomplicated: Secondary | ICD-10-CM | POA: Diagnosis not present

## 2023-06-25 DIAGNOSIS — Z743 Need for continuous supervision: Secondary | ICD-10-CM | POA: Diagnosis not present

## 2023-06-25 DIAGNOSIS — T300 Burn of unspecified body region, unspecified degree: Secondary | ICD-10-CM | POA: Diagnosis not present

## 2023-06-25 DIAGNOSIS — F419 Anxiety disorder, unspecified: Secondary | ICD-10-CM | POA: Diagnosis not present

## 2023-07-01 DIAGNOSIS — J9611 Chronic respiratory failure with hypoxia: Secondary | ICD-10-CM | POA: Diagnosis not present

## 2023-07-01 DIAGNOSIS — J449 Chronic obstructive pulmonary disease, unspecified: Secondary | ICD-10-CM | POA: Diagnosis not present

## 2023-07-02 DIAGNOSIS — G894 Chronic pain syndrome: Secondary | ICD-10-CM | POA: Diagnosis not present

## 2023-07-02 DIAGNOSIS — C61 Malignant neoplasm of prostate: Secondary | ICD-10-CM | POA: Diagnosis not present

## 2023-07-02 DIAGNOSIS — Z1389 Encounter for screening for other disorder: Secondary | ICD-10-CM | POA: Diagnosis not present

## 2023-07-02 DIAGNOSIS — M4302 Spondylolysis, cervical region: Secondary | ICD-10-CM | POA: Diagnosis not present

## 2023-07-02 DIAGNOSIS — S32010A Wedge compression fracture of first lumbar vertebra, initial encounter for closed fracture: Secondary | ICD-10-CM | POA: Diagnosis not present

## 2023-07-02 DIAGNOSIS — M47816 Spondylosis without myelopathy or radiculopathy, lumbar region: Secondary | ICD-10-CM | POA: Diagnosis not present

## 2023-07-02 DIAGNOSIS — Z79891 Long term (current) use of opiate analgesic: Secondary | ICD-10-CM | POA: Diagnosis not present

## 2023-07-02 DIAGNOSIS — S22080A Wedge compression fracture of T11-T12 vertebra, initial encounter for closed fracture: Secondary | ICD-10-CM | POA: Diagnosis not present

## 2023-07-05 DIAGNOSIS — J9611 Chronic respiratory failure with hypoxia: Secondary | ICD-10-CM | POA: Diagnosis not present

## 2023-07-05 DIAGNOSIS — J449 Chronic obstructive pulmonary disease, unspecified: Secondary | ICD-10-CM | POA: Diagnosis not present

## 2023-07-09 DIAGNOSIS — T2020XA Burn of second degree of head, face, and neck, unspecified site, initial encounter: Secondary | ICD-10-CM | POA: Insufficient documentation

## 2023-07-17 DIAGNOSIS — T2020XS Burn of second degree of head, face, and neck, unspecified site, sequela: Secondary | ICD-10-CM | POA: Diagnosis not present

## 2023-07-17 DIAGNOSIS — Z792 Long term (current) use of antibiotics: Secondary | ICD-10-CM | POA: Diagnosis not present

## 2023-07-17 DIAGNOSIS — E785 Hyperlipidemia, unspecified: Secondary | ICD-10-CM | POA: Diagnosis not present

## 2023-07-17 DIAGNOSIS — L905 Scar conditions and fibrosis of skin: Secondary | ICD-10-CM | POA: Diagnosis not present

## 2023-07-17 DIAGNOSIS — Z66 Do not resuscitate: Secondary | ICD-10-CM | POA: Diagnosis not present

## 2023-07-17 DIAGNOSIS — R918 Other nonspecific abnormal finding of lung field: Secondary | ICD-10-CM | POA: Diagnosis not present

## 2023-07-17 DIAGNOSIS — G2581 Restless legs syndrome: Secondary | ICD-10-CM | POA: Diagnosis not present

## 2023-07-17 DIAGNOSIS — M5412 Radiculopathy, cervical region: Secondary | ICD-10-CM | POA: Diagnosis not present

## 2023-07-17 DIAGNOSIS — Z7409 Other reduced mobility: Secondary | ICD-10-CM | POA: Diagnosis not present

## 2023-07-17 DIAGNOSIS — Z7983 Long term (current) use of bisphosphonates: Secondary | ICD-10-CM | POA: Diagnosis not present

## 2023-07-17 DIAGNOSIS — Z79891 Long term (current) use of opiate analgesic: Secondary | ICD-10-CM | POA: Diagnosis not present

## 2023-07-17 DIAGNOSIS — F419 Anxiety disorder, unspecified: Secondary | ICD-10-CM | POA: Diagnosis not present

## 2023-07-17 DIAGNOSIS — I2510 Atherosclerotic heart disease of native coronary artery without angina pectoris: Secondary | ICD-10-CM | POA: Diagnosis not present

## 2023-07-17 DIAGNOSIS — M810 Age-related osteoporosis without current pathological fracture: Secondary | ICD-10-CM | POA: Diagnosis not present

## 2023-07-17 DIAGNOSIS — Z972 Presence of dental prosthetic device (complete) (partial): Secondary | ICD-10-CM | POA: Diagnosis not present

## 2023-07-17 DIAGNOSIS — X088XXS Exposure to other specified smoke, fire and flames, sequela: Secondary | ICD-10-CM | POA: Diagnosis not present

## 2023-07-17 DIAGNOSIS — F32A Depression, unspecified: Secondary | ICD-10-CM | POA: Diagnosis not present

## 2023-07-17 DIAGNOSIS — J449 Chronic obstructive pulmonary disease, unspecified: Secondary | ICD-10-CM | POA: Diagnosis not present

## 2023-07-17 DIAGNOSIS — Z9981 Dependence on supplemental oxygen: Secondary | ICD-10-CM | POA: Diagnosis not present

## 2023-07-17 DIAGNOSIS — T2020XA Burn of second degree of head, face, and neck, unspecified site, initial encounter: Secondary | ICD-10-CM | POA: Diagnosis not present

## 2023-07-17 DIAGNOSIS — Z79899 Other long term (current) drug therapy: Secondary | ICD-10-CM | POA: Diagnosis not present

## 2023-07-17 DIAGNOSIS — Z7982 Long term (current) use of aspirin: Secondary | ICD-10-CM | POA: Diagnosis not present

## 2023-07-20 DIAGNOSIS — J189 Pneumonia, unspecified organism: Secondary | ICD-10-CM | POA: Diagnosis not present

## 2023-07-29 DIAGNOSIS — J449 Chronic obstructive pulmonary disease, unspecified: Secondary | ICD-10-CM | POA: Diagnosis not present

## 2023-07-29 DIAGNOSIS — J9611 Chronic respiratory failure with hypoxia: Secondary | ICD-10-CM | POA: Diagnosis not present

## 2023-07-30 DIAGNOSIS — M47816 Spondylosis without myelopathy or radiculopathy, lumbar region: Secondary | ICD-10-CM | POA: Diagnosis not present

## 2023-07-30 DIAGNOSIS — G894 Chronic pain syndrome: Secondary | ICD-10-CM | POA: Diagnosis not present

## 2023-07-30 DIAGNOSIS — M4302 Spondylolysis, cervical region: Secondary | ICD-10-CM | POA: Diagnosis not present

## 2023-07-30 DIAGNOSIS — Z79891 Long term (current) use of opiate analgesic: Secondary | ICD-10-CM | POA: Diagnosis not present

## 2023-07-30 DIAGNOSIS — C61 Malignant neoplasm of prostate: Secondary | ICD-10-CM | POA: Diagnosis not present

## 2023-07-30 DIAGNOSIS — S22080A Wedge compression fracture of T11-T12 vertebra, initial encounter for closed fracture: Secondary | ICD-10-CM | POA: Diagnosis not present

## 2023-07-30 DIAGNOSIS — M549 Dorsalgia, unspecified: Secondary | ICD-10-CM | POA: Diagnosis not present

## 2023-07-30 DIAGNOSIS — S32010A Wedge compression fracture of first lumbar vertebra, initial encounter for closed fracture: Secondary | ICD-10-CM | POA: Diagnosis not present

## 2023-07-30 DIAGNOSIS — Z1389 Encounter for screening for other disorder: Secondary | ICD-10-CM | POA: Diagnosis not present

## 2023-07-31 DIAGNOSIS — F32A Depression, unspecified: Secondary | ICD-10-CM | POA: Diagnosis not present

## 2023-07-31 DIAGNOSIS — I2510 Atherosclerotic heart disease of native coronary artery without angina pectoris: Secondary | ICD-10-CM | POA: Diagnosis not present

## 2023-07-31 DIAGNOSIS — X088XXS Exposure to other specified smoke, fire and flames, sequela: Secondary | ICD-10-CM | POA: Diagnosis not present

## 2023-07-31 DIAGNOSIS — Z7983 Long term (current) use of bisphosphonates: Secondary | ICD-10-CM | POA: Diagnosis not present

## 2023-07-31 DIAGNOSIS — T310 Burns involving less than 10% of body surface: Secondary | ICD-10-CM | POA: Diagnosis not present

## 2023-07-31 DIAGNOSIS — M810 Age-related osteoporosis without current pathological fracture: Secondary | ICD-10-CM | POA: Diagnosis not present

## 2023-07-31 DIAGNOSIS — J449 Chronic obstructive pulmonary disease, unspecified: Secondary | ICD-10-CM | POA: Diagnosis not present

## 2023-07-31 DIAGNOSIS — L910 Hypertrophic scar: Secondary | ICD-10-CM | POA: Insufficient documentation

## 2023-07-31 DIAGNOSIS — Z9981 Dependence on supplemental oxygen: Secondary | ICD-10-CM | POA: Diagnosis not present

## 2023-07-31 DIAGNOSIS — E785 Hyperlipidemia, unspecified: Secondary | ICD-10-CM | POA: Diagnosis not present

## 2023-07-31 DIAGNOSIS — R918 Other nonspecific abnormal finding of lung field: Secondary | ICD-10-CM | POA: Diagnosis not present

## 2023-07-31 DIAGNOSIS — G2581 Restless legs syndrome: Secondary | ICD-10-CM | POA: Diagnosis not present

## 2023-07-31 DIAGNOSIS — Z7409 Other reduced mobility: Secondary | ICD-10-CM | POA: Diagnosis not present

## 2023-07-31 DIAGNOSIS — Z7982 Long term (current) use of aspirin: Secondary | ICD-10-CM | POA: Diagnosis not present

## 2023-07-31 DIAGNOSIS — F419 Anxiety disorder, unspecified: Secondary | ICD-10-CM | POA: Diagnosis not present

## 2023-07-31 DIAGNOSIS — Z860100 Personal history of colon polyps, unspecified: Secondary | ICD-10-CM | POA: Diagnosis not present

## 2023-07-31 DIAGNOSIS — Z79899 Other long term (current) drug therapy: Secondary | ICD-10-CM | POA: Diagnosis not present

## 2023-07-31 DIAGNOSIS — T2020XS Burn of second degree of head, face, and neck, unspecified site, sequela: Secondary | ICD-10-CM | POA: Diagnosis not present

## 2023-07-31 DIAGNOSIS — Z7951 Long term (current) use of inhaled steroids: Secondary | ICD-10-CM | POA: Diagnosis not present

## 2023-07-31 DIAGNOSIS — M5010 Cervical disc disorder with radiculopathy, unspecified cervical region: Secondary | ICD-10-CM | POA: Diagnosis not present

## 2023-08-02 DIAGNOSIS — J449 Chronic obstructive pulmonary disease, unspecified: Secondary | ICD-10-CM | POA: Diagnosis not present

## 2023-08-02 DIAGNOSIS — J9611 Chronic respiratory failure with hypoxia: Secondary | ICD-10-CM | POA: Diagnosis not present

## 2023-08-26 DIAGNOSIS — C61 Malignant neoplasm of prostate: Secondary | ICD-10-CM | POA: Diagnosis not present

## 2023-08-26 DIAGNOSIS — M4302 Spondylolysis, cervical region: Secondary | ICD-10-CM | POA: Diagnosis not present

## 2023-08-26 DIAGNOSIS — M47816 Spondylosis without myelopathy or radiculopathy, lumbar region: Secondary | ICD-10-CM | POA: Diagnosis not present

## 2023-08-26 DIAGNOSIS — G894 Chronic pain syndrome: Secondary | ICD-10-CM | POA: Diagnosis not present

## 2023-08-26 DIAGNOSIS — S22080A Wedge compression fracture of T11-T12 vertebra, initial encounter for closed fracture: Secondary | ICD-10-CM | POA: Diagnosis not present

## 2023-08-26 DIAGNOSIS — Z79891 Long term (current) use of opiate analgesic: Secondary | ICD-10-CM | POA: Diagnosis not present

## 2023-08-26 DIAGNOSIS — Z1389 Encounter for screening for other disorder: Secondary | ICD-10-CM | POA: Diagnosis not present

## 2023-08-26 DIAGNOSIS — S32010A Wedge compression fracture of first lumbar vertebra, initial encounter for closed fracture: Secondary | ICD-10-CM | POA: Diagnosis not present

## 2023-08-29 DIAGNOSIS — J9611 Chronic respiratory failure with hypoxia: Secondary | ICD-10-CM | POA: Diagnosis not present

## 2023-08-29 DIAGNOSIS — I110 Hypertensive heart disease with heart failure: Secondary | ICD-10-CM | POA: Diagnosis not present

## 2023-08-29 DIAGNOSIS — F1120 Opioid dependence, uncomplicated: Secondary | ICD-10-CM | POA: Diagnosis not present

## 2023-08-29 DIAGNOSIS — E785 Hyperlipidemia, unspecified: Secondary | ICD-10-CM | POA: Diagnosis not present

## 2023-08-29 DIAGNOSIS — I25119 Atherosclerotic heart disease of native coronary artery with unspecified angina pectoris: Secondary | ICD-10-CM | POA: Diagnosis not present

## 2023-08-29 DIAGNOSIS — D84821 Immunodeficiency due to drugs: Secondary | ICD-10-CM | POA: Diagnosis not present

## 2023-08-29 DIAGNOSIS — J479 Bronchiectasis, uncomplicated: Secondary | ICD-10-CM | POA: Diagnosis not present

## 2023-08-29 DIAGNOSIS — K5190 Ulcerative colitis, unspecified, without complications: Secondary | ICD-10-CM | POA: Diagnosis not present

## 2023-08-29 DIAGNOSIS — I509 Heart failure, unspecified: Secondary | ICD-10-CM | POA: Diagnosis not present

## 2023-08-29 DIAGNOSIS — F325 Major depressive disorder, single episode, in full remission: Secondary | ICD-10-CM | POA: Diagnosis not present

## 2023-08-29 DIAGNOSIS — J449 Chronic obstructive pulmonary disease, unspecified: Secondary | ICD-10-CM | POA: Diagnosis not present

## 2023-08-29 DIAGNOSIS — I700 Atherosclerosis of aorta: Secondary | ICD-10-CM | POA: Diagnosis not present

## 2023-08-29 DIAGNOSIS — J439 Emphysema, unspecified: Secondary | ICD-10-CM | POA: Diagnosis not present

## 2023-09-02 DIAGNOSIS — J9611 Chronic respiratory failure with hypoxia: Secondary | ICD-10-CM | POA: Diagnosis not present

## 2023-09-02 DIAGNOSIS — J449 Chronic obstructive pulmonary disease, unspecified: Secondary | ICD-10-CM | POA: Diagnosis not present

## 2023-09-17 ENCOUNTER — Ambulatory Visit: Admitting: Cardiology

## 2023-09-28 DIAGNOSIS — J9611 Chronic respiratory failure with hypoxia: Secondary | ICD-10-CM | POA: Diagnosis not present

## 2023-09-28 DIAGNOSIS — J449 Chronic obstructive pulmonary disease, unspecified: Secondary | ICD-10-CM | POA: Diagnosis not present

## 2023-10-02 DIAGNOSIS — J9611 Chronic respiratory failure with hypoxia: Secondary | ICD-10-CM | POA: Diagnosis not present

## 2023-10-02 DIAGNOSIS — J449 Chronic obstructive pulmonary disease, unspecified: Secondary | ICD-10-CM | POA: Diagnosis not present

## 2023-10-03 DIAGNOSIS — F17200 Nicotine dependence, unspecified, uncomplicated: Secondary | ICD-10-CM | POA: Diagnosis not present

## 2023-10-03 DIAGNOSIS — G894 Chronic pain syndrome: Secondary | ICD-10-CM | POA: Diagnosis not present

## 2023-10-03 DIAGNOSIS — I2510 Atherosclerotic heart disease of native coronary artery without angina pectoris: Secondary | ICD-10-CM | POA: Diagnosis not present

## 2023-10-03 DIAGNOSIS — E785 Hyperlipidemia, unspecified: Secondary | ICD-10-CM | POA: Diagnosis not present

## 2023-10-03 DIAGNOSIS — I10 Essential (primary) hypertension: Secondary | ICD-10-CM | POA: Diagnosis not present

## 2023-10-03 DIAGNOSIS — J449 Chronic obstructive pulmonary disease, unspecified: Secondary | ICD-10-CM | POA: Diagnosis not present

## 2023-10-03 DIAGNOSIS — E7800 Pure hypercholesterolemia, unspecified: Secondary | ICD-10-CM | POA: Diagnosis not present

## 2023-10-23 DIAGNOSIS — G894 Chronic pain syndrome: Secondary | ICD-10-CM | POA: Insufficient documentation

## 2023-10-23 DIAGNOSIS — M47817 Spondylosis without myelopathy or radiculopathy, lumbosacral region: Secondary | ICD-10-CM | POA: Insufficient documentation

## 2023-10-24 ENCOUNTER — Encounter: Payer: Self-pay | Admitting: Cardiology

## 2023-10-24 ENCOUNTER — Ambulatory Visit: Attending: Cardiology | Admitting: Cardiology

## 2023-10-24 VITALS — BP 108/70 | HR 73 | Ht 67.0 in | Wt 126.0 lb

## 2023-10-24 DIAGNOSIS — R0989 Other specified symptoms and signs involving the circulatory and respiratory systems: Secondary | ICD-10-CM

## 2023-10-24 DIAGNOSIS — I2510 Atherosclerotic heart disease of native coronary artery without angina pectoris: Secondary | ICD-10-CM | POA: Diagnosis not present

## 2023-10-24 DIAGNOSIS — J42 Unspecified chronic bronchitis: Secondary | ICD-10-CM | POA: Diagnosis not present

## 2023-10-24 DIAGNOSIS — I10 Essential (primary) hypertension: Secondary | ICD-10-CM | POA: Diagnosis not present

## 2023-10-24 DIAGNOSIS — E785 Hyperlipidemia, unspecified: Secondary | ICD-10-CM | POA: Diagnosis not present

## 2023-10-24 MED ORDER — ISOSORBIDE MONONITRATE ER 60 MG PO TB24: 60.0000 mg | ORAL_TABLET | Freq: Every day | ORAL | 3 refills | Status: AC

## 2023-10-24 NOTE — Addendum Note (Signed)
 Addended by: Shawnee Dellen D on: 10/24/2023 09:43 AM   Modules accepted: Orders

## 2023-10-24 NOTE — Patient Instructions (Addendum)
 Medication Instructions:   INCREASE: Imdur  to 60mg  daily   Lab Work: None Ordered If you have labs (blood work) drawn today and your tests are completely normal, you will receive your results only by: MyChart Message (if you have MyChart) OR A paper copy in the mail If you have any lab test that is abnormal or we need to change your treatment, we will call you to review the results.   Testing/Procedures: Your physician has requested that you have a carotid duplex. This test is an ultrasound of the carotid arteries in your neck. It looks at blood flow through these arteries that supply the brain with blood. Allow one hour for this exam. There are no restrictions or special instructions.   [facility] [address] [place], [place] [address] [phone]   Follow-Up: At [facility], you and your health needs are our priority.  As part of our continuing mission to provide you with exceptional heart care, we have created designated Provider Care Teams.  These Care Teams include your primary Cardiologist (physician) and Advanced Practice Providers (APPs -  Physician Assistants and Nurse Practitioners) who all work together to provide you with the care you need, when you need it.  We recommend signing up for the patient portal called "MyChart".  Sign up information is provided on this After Visit Summary.  MyChart is used to connect with patients for Virtual Visits (Telemedicine).  Patients are able to view lab/test results, encounter notes, upcoming appointments, etc.  Non-urgent messages can be sent to your provider as well.   To learn more about what you can do with MyChart, go to [email or web address].    Your next appointment:   6 month(s)  The format for your next appointment:   In Person  Provider:   Ralene Burger, MD    Other Instructions NA

## 2023-10-24 NOTE — Progress Notes (Signed)
 Cardiology Office Note:    Date:  10/24/2023   ID:  Corey Webb, DOB 1951-01-25, MRN [id number]  PCP:  Annette Barters, MD  Cardiologist:  Ralene Burger, MD    Referring MD: Annette Barters, MD   Chief Complaint  Patient presents with   Follow-up    History of Present Illness:    Corey Webb is a 73 y.o. male past medical history significant for COPD smoking sadly he still continues to smoke about 3 to 5 cigarettes a day he is O2 dependent, also dyslipidemia essential hypertension cardiac catheterization done 30 years ago showing nonobstructive lesion however recent CT show significant calcification stress test done thereafter showed small area of ischemia involving midportion of the inferior wall comes today 2 months for follow-up overall he is doing fine he is mainly very sedentary lifestyle because of dyspnea exertion.  He described to have some chest pain sometimes when he tried to do things.  Past Medical History:  Diagnosis Date   Arthritis    DDD   CAD in native artery 05/24/2016   Cancer [facility])    COPD (chronic obstructive pulmonary disease) (HCC)    Dyslipidemia 01/10/2017   Essential hypertension 01/10/2017   Hyperlipidemia    Myocardial infarction [facility])     Past Surgical History:  Procedure Laterality Date   BACK SURGERY     CATARACT EXTRACTION Bilateral    EYE SURGERY     HERNIA REPAIR Right 08/10/2019   Inquinal   NECK SURGERY     REFRACTIVE SURGERY Bilateral     Current Medications: Current Meds  Medication Sig   albuterol  (VENTOLIN  HFA) 108 (90 Base) MCG/ACT inhaler Inhale 1-2 puffs into the lungs every 6 (six) hours as needed for wheezing or shortness of breath.   alendronate (FOSAMAX) 70 MG tablet Take 70 mg by mouth once a week.   aspirin 325 MG tablet Take 325 mg by mouth daily.   bicalutamide (CASODEX) 50 MG tablet Take 50 mg by mouth daily.   DULoxetine (CYMBALTA) 60 MG capsule Take 60 mg by mouth daily.   fluticasone (FLONASE) 50  MCG/ACT nasal spray Place 1 spray into both nostrils 2 (two) times daily.   furosemide (LASIX) 20 MG tablet Take 20 mg by mouth daily.   ipratropium-albuterol  (DUONEB) 0.5-2.5 (3) MG/3ML SOLN Take 3 mLs by nebulization every 6 (six) hours as needed (shortness of breath).   isosorbide  mononitrate (IMDUR ) 30 MG 24 hr tablet Take 1 tablet (30 mg total) by mouth daily.   mirtazapine (REMERON) 15 MG tablet Take 15 mg by mouth at bedtime.   omeprazole (PRILOSEC) 40 MG capsule Take 1 capsule by mouth daily.   oxybutynin (DITROPAN-XL) 5 MG 24 hr tablet Take 5 mg by mouth at bedtime.   oxyCODONE (ROXICODONE) 15 MG immediate release tablet Take 15 mg by mouth every 8 (eight) hours as needed for pain.   pravastatin (PRAVACHOL) 40 MG tablet Take 40 mg by mouth daily.   solifenacin (VESICARE) 10 MG tablet Take 10 mg by mouth at bedtime.   tamsulosin  (FLOMAX ) 0.4 MG CAPS capsule TAKE 1 CAPSULE BY MOUTH 2 TIMES DAILY.   traZODone (DESYREL) 50 MG tablet Take 50 mg by mouth at bedtime.   verapamil (CALAN-SR) 180 MG CR tablet Take 180 mg by mouth at bedtime.   WIXELA INHUB 250-50 MCG/ACT AEPB Inhale 1 puff into the lungs in the morning and at bedtime.   [DISCONTINUED] ABRYSVO 120 MCG/0.5ML injection Inject 0.5 mLs into the  muscle once.   [DISCONTINUED] LORazepam (ATIVAN) 0.5 MG tablet Take 0.5 mg by mouth every 8 (eight) hours as needed for anxiety.   [DISCONTINUED] TRELEGY ELLIPTA 100-62.5-25 MCG/ACT AEPB Inhale 1 puff into the lungs daily.   [DISCONTINUED] vancomycin  (VANCOCIN ) 125 MG capsule Take 125 mg by mouth 4 (four) times daily.   Current Facility-Administered Medications for the 10/24/23 encounter (Office Visit) with Susana Duell J, MD  Medication   triamcinolone  acetonide (KENALOG ) 10 MG/ML injection 10 mg     Allergies:   Atorvastatin, Clonazepam, Ropinirole, and Varenicline   Social History   Socioeconomic History   Marital status: Married    Spouse name: Not on file   Number of children:  Not on file   Years of education: Not on file   Highest education level: Not on file  Occupational History   Not on file  Tobacco Use   Smoking status: Every Day    Types: Cigarettes   Smokeless tobacco: Never  Substance and Sexual Activity   Alcohol use: Never   Drug use: Never   Sexual activity: Not on file  Other Topics Concern   Not on file  Social History Narrative   Not on file   Social Drivers of Health   Financial Resource Strain: Low Risk  (02/04/2023)   Received from [facility]   Overall Financial Resource Strain (CARDIA)    Difficulty of Paying Living Expenses: Not hard at all  Food Insecurity: No Food Insecurity (02/04/2023)   Received from [facility]   Hunger Vital Sign    Worried About Running Out of Food in the Last Year: Never true    Ran Out of Food in the Last Year: Never true  Transportation Needs: No Transportation Needs (02/04/2023)   Received from [facility]   PRAPARE - Transportation    Lack of Transportation (Medical): No    Lack of Transportation (Non-Medical): No  Physical Activity: Not on file  Stress: Not on file  Social Connections: Not on file     Family History: The patient's family history includes Cancer in his mother; Heart attack in his father. ROS:   Please see the history of present illness.    All 14 point review of systems negative except as described per history of present illness  EKGs/Labs/Other Studies Reviewed:         Recent Labs: No results found for requested labs within last 365 days.  Recent Lipid Panel No results found for: "CHOL", "TRIG", "HDL", "CHOLHDL", "VLDL", "LDLCALC", "LDLDIRECT"  Physical Exam:    VS:  BP 108/70 (BP Location: Left Arm, Patient Position: Sitting)   Pulse 73   Ht 5\' 7"  (1.702 m)   Wt 126 lb (57.2 kg)   SpO2 93%   BMI 19.73 kg/m     Wt Readings from Last 3 Encounters:  10/24/23 126 lb (57.2 kg)  03/06/23 112 lb 12.8 oz (51.2 kg)  01/03/23 122 lb 12.8 oz (55.7 kg)      GEN:  Well nourished, well developed in no acute distress HEENT: Normal NECK: No JVD; No carotid bruits LYMPHATICS: No lymphadenopathy CARDIAC: RRR, no murmurs, no rubs, no gallops RESPIRATORY: Lungs are overinflated poor air entry bilaterally ABDOMEN: Soft, non-tender, non-distended MUSCULOSKELETAL:  No edema; No deformity  SKIN: Warm and dry LOWER EXTREMITIES: no swelling NEUROLOGIC:  Alert and oriented x 3 PSYCHIATRIC:  Normal affect   ASSESSMENT:    1. CAD in native artery   2. Essential hypertension  3. Chronic bronchitis, unspecified chronic bronchitis type (HCC)   4. Dyslipidemia   5. Left carotid bruit    PLAN:    In order of problems listed above:  Coronary artery disease abnormal stress test we will try to put him on medical therapy will do Imdur  30 mg daily, Essential hypertension blood pressure well-controlled on present management. 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 Smoking still a problem Long discussion but he clearly from the beginning told me that he is sleeping.   Medication Adjustments/Labs and Tests Ordered: Current medicines are reviewed at length with the patient today.  Concerns regarding medicines are outlined above.  Orders Placed This Encounter  Procedures   US  Carotid Duplex Bilateral   Medication changes: No orders of the defined types were placed in this encounter.   Signed, Manfred Seed, MD, [facility] 10/24/2023 9:30 AM    [facility]

## 2023-10-29 DIAGNOSIS — J449 Chronic obstructive pulmonary disease, unspecified: Secondary | ICD-10-CM | POA: Diagnosis not present

## 2023-10-29 DIAGNOSIS — J9611 Chronic respiratory failure with hypoxia: Secondary | ICD-10-CM | POA: Diagnosis not present

## 2023-11-02 DIAGNOSIS — J449 Chronic obstructive pulmonary disease, unspecified: Secondary | ICD-10-CM | POA: Diagnosis not present

## 2023-11-02 DIAGNOSIS — J9611 Chronic respiratory failure with hypoxia: Secondary | ICD-10-CM | POA: Diagnosis not present

## 2023-11-04 ENCOUNTER — Ambulatory Visit (HOSPITAL_BASED_OUTPATIENT_CLINIC_OR_DEPARTMENT_OTHER)
Admission: RE | Admit: 2023-11-04 | Discharge: 2023-11-04 | Disposition: A | Discharge status: Home / Self Care | Source: Ambulatory Visit | Attending: Cardiology | Admitting: Cardiology

## 2023-11-04 DIAGNOSIS — I6523 Occlusion and stenosis of bilateral carotid arteries: Secondary | ICD-10-CM | POA: Diagnosis not present

## 2023-11-04 DIAGNOSIS — R0989 Other specified symptoms and signs involving the circulatory and respiratory systems: Secondary | ICD-10-CM | POA: Diagnosis not present

## 2023-11-04 DIAGNOSIS — I2510 Atherosclerotic heart disease of native coronary artery without angina pectoris: Secondary | ICD-10-CM | POA: Diagnosis not present

## 2023-11-04 DIAGNOSIS — E785 Hyperlipidemia, unspecified: Secondary | ICD-10-CM | POA: Diagnosis not present

## 2023-11-06 DIAGNOSIS — J449 Chronic obstructive pulmonary disease, unspecified: Secondary | ICD-10-CM | POA: Diagnosis not present

## 2023-11-06 DIAGNOSIS — I2510 Atherosclerotic heart disease of native coronary artery without angina pectoris: Secondary | ICD-10-CM | POA: Diagnosis not present

## 2023-11-06 DIAGNOSIS — I10 Essential (primary) hypertension: Secondary | ICD-10-CM | POA: Diagnosis not present

## 2023-11-06 DIAGNOSIS — E785 Hyperlipidemia, unspecified: Secondary | ICD-10-CM | POA: Diagnosis not present

## 2023-11-06 DIAGNOSIS — E7800 Pure hypercholesterolemia, unspecified: Secondary | ICD-10-CM | POA: Diagnosis not present

## 2023-11-06 DIAGNOSIS — M47816 Spondylosis without myelopathy or radiculopathy, lumbar region: Secondary | ICD-10-CM | POA: Diagnosis not present

## 2023-11-06 DIAGNOSIS — Z79891 Long term (current) use of opiate analgesic: Secondary | ICD-10-CM | POA: Diagnosis not present

## 2023-11-08 DIAGNOSIS — D539 Nutritional anemia, unspecified: Secondary | ICD-10-CM | POA: Diagnosis not present

## 2023-11-08 DIAGNOSIS — I739 Peripheral vascular disease, unspecified: Secondary | ICD-10-CM | POA: Diagnosis not present

## 2023-11-08 DIAGNOSIS — M810 Age-related osteoporosis without current pathological fracture: Secondary | ICD-10-CM | POA: Diagnosis not present

## 2023-11-08 DIAGNOSIS — C61 Malignant neoplasm of prostate: Secondary | ICD-10-CM | POA: Diagnosis not present

## 2023-11-08 DIAGNOSIS — Z79899 Other long term (current) drug therapy: Secondary | ICD-10-CM | POA: Diagnosis not present

## 2023-11-08 DIAGNOSIS — E7800 Pure hypercholesterolemia, unspecified: Secondary | ICD-10-CM | POA: Diagnosis not present

## 2023-11-08 DIAGNOSIS — R600 Localized edema: Secondary | ICD-10-CM | POA: Diagnosis not present

## 2023-11-08 DIAGNOSIS — Z23 Encounter for immunization: Secondary | ICD-10-CM | POA: Diagnosis not present

## 2023-11-08 DIAGNOSIS — J449 Chronic obstructive pulmonary disease, unspecified: Secondary | ICD-10-CM | POA: Diagnosis not present

## 2023-11-08 DIAGNOSIS — J9611 Chronic respiratory failure with hypoxia: Secondary | ICD-10-CM | POA: Diagnosis not present

## 2023-11-08 DIAGNOSIS — F1120 Opioid dependence, uncomplicated: Secondary | ICD-10-CM | POA: Diagnosis not present

## 2023-11-08 DIAGNOSIS — D649 Anemia, unspecified: Secondary | ICD-10-CM | POA: Diagnosis not present

## 2023-11-10 ENCOUNTER — Ambulatory Visit: Payer: Self-pay | Admitting: Cardiology

## 2023-11-13 DIAGNOSIS — J449 Chronic obstructive pulmonary disease, unspecified: Secondary | ICD-10-CM | POA: Diagnosis not present

## 2023-11-13 DIAGNOSIS — J9611 Chronic respiratory failure with hypoxia: Secondary | ICD-10-CM | POA: Diagnosis not present

## 2023-11-13 DIAGNOSIS — J9612 Chronic respiratory failure with hypercapnia: Secondary | ICD-10-CM | POA: Diagnosis not present

## 2023-11-15 DIAGNOSIS — R351 Nocturia: Secondary | ICD-10-CM | POA: Diagnosis not present

## 2023-11-15 DIAGNOSIS — C61 Malignant neoplasm of prostate: Secondary | ICD-10-CM | POA: Diagnosis not present

## 2023-11-28 DIAGNOSIS — J9611 Chronic respiratory failure with hypoxia: Secondary | ICD-10-CM | POA: Diagnosis not present

## 2023-11-28 DIAGNOSIS — J449 Chronic obstructive pulmonary disease, unspecified: Secondary | ICD-10-CM | POA: Diagnosis not present

## 2023-12-02 DIAGNOSIS — J449 Chronic obstructive pulmonary disease, unspecified: Secondary | ICD-10-CM | POA: Diagnosis not present

## 2023-12-02 DIAGNOSIS — J9611 Chronic respiratory failure with hypoxia: Secondary | ICD-10-CM | POA: Diagnosis not present

## 2023-12-10 DIAGNOSIS — E785 Hyperlipidemia, unspecified: Secondary | ICD-10-CM | POA: Diagnosis not present

## 2023-12-10 DIAGNOSIS — Z79891 Long term (current) use of opiate analgesic: Secondary | ICD-10-CM | POA: Diagnosis not present

## 2023-12-10 DIAGNOSIS — M47816 Spondylosis without myelopathy or radiculopathy, lumbar region: Secondary | ICD-10-CM | POA: Diagnosis not present

## 2023-12-10 DIAGNOSIS — J449 Chronic obstructive pulmonary disease, unspecified: Secondary | ICD-10-CM | POA: Diagnosis not present

## 2023-12-10 DIAGNOSIS — I10 Essential (primary) hypertension: Secondary | ICD-10-CM | POA: Diagnosis not present

## 2023-12-10 DIAGNOSIS — E7800 Pure hypercholesterolemia, unspecified: Secondary | ICD-10-CM | POA: Diagnosis not present

## 2023-12-10 DIAGNOSIS — I2510 Atherosclerotic heart disease of native coronary artery without angina pectoris: Secondary | ICD-10-CM | POA: Diagnosis not present

## 2023-12-11 DIAGNOSIS — D509 Iron deficiency anemia, unspecified: Secondary | ICD-10-CM | POA: Diagnosis not present

## 2023-12-11 DIAGNOSIS — E538 Deficiency of other specified B group vitamins: Secondary | ICD-10-CM | POA: Diagnosis not present

## 2023-12-19 ENCOUNTER — Telehealth: Payer: Self-pay

## 2023-12-19 DIAGNOSIS — I6529 Occlusion and stenosis of unspecified carotid artery: Secondary | ICD-10-CM

## 2023-12-19 NOTE — Telephone Encounter (Signed)
 Discussed with spouse at appt 12-19-23- Referral made to vascular surgeon- per Dr. Karry note

## 2023-12-29 DIAGNOSIS — J9611 Chronic respiratory failure with hypoxia: Secondary | ICD-10-CM | POA: Diagnosis not present

## 2023-12-29 DIAGNOSIS — J449 Chronic obstructive pulmonary disease, unspecified: Secondary | ICD-10-CM | POA: Diagnosis not present

## 2024-01-02 DIAGNOSIS — J449 Chronic obstructive pulmonary disease, unspecified: Secondary | ICD-10-CM | POA: Diagnosis not present

## 2024-01-02 DIAGNOSIS — J9611 Chronic respiratory failure with hypoxia: Secondary | ICD-10-CM | POA: Diagnosis not present

## 2024-01-15 DIAGNOSIS — I10 Essential (primary) hypertension: Secondary | ICD-10-CM | POA: Diagnosis not present

## 2024-01-15 DIAGNOSIS — E7800 Pure hypercholesterolemia, unspecified: Secondary | ICD-10-CM | POA: Diagnosis not present

## 2024-01-15 DIAGNOSIS — E785 Hyperlipidemia, unspecified: Secondary | ICD-10-CM | POA: Diagnosis not present

## 2024-01-15 DIAGNOSIS — M47816 Spondylosis without myelopathy or radiculopathy, lumbar region: Secondary | ICD-10-CM | POA: Diagnosis not present

## 2024-01-15 DIAGNOSIS — Z79891 Long term (current) use of opiate analgesic: Secondary | ICD-10-CM | POA: Diagnosis not present

## 2024-01-15 DIAGNOSIS — I2510 Atherosclerotic heart disease of native coronary artery without angina pectoris: Secondary | ICD-10-CM | POA: Diagnosis not present

## 2024-01-15 DIAGNOSIS — J449 Chronic obstructive pulmonary disease, unspecified: Secondary | ICD-10-CM | POA: Diagnosis not present

## 2024-01-17 DIAGNOSIS — G894 Chronic pain syndrome: Secondary | ICD-10-CM | POA: Diagnosis not present

## 2024-01-17 DIAGNOSIS — M47816 Spondylosis without myelopathy or radiculopathy, lumbar region: Secondary | ICD-10-CM | POA: Diagnosis not present

## 2024-01-29 ENCOUNTER — Ambulatory Visit: Attending: Vascular Surgery | Admitting: Vascular Surgery

## 2024-01-29 ENCOUNTER — Encounter: Payer: Self-pay | Admitting: Vascular Surgery

## 2024-01-29 VITALS — BP 112/65 | HR 81 | Temp 99.2°F

## 2024-01-29 DIAGNOSIS — I6523 Occlusion and stenosis of bilateral carotid arteries: Secondary | ICD-10-CM | POA: Diagnosis not present

## 2024-01-29 DIAGNOSIS — J9611 Chronic respiratory failure with hypoxia: Secondary | ICD-10-CM | POA: Diagnosis not present

## 2024-01-29 DIAGNOSIS — J449 Chronic obstructive pulmonary disease, unspecified: Secondary | ICD-10-CM | POA: Diagnosis not present

## 2024-01-29 NOTE — Progress Notes (Signed)
 Patient ID: Corey Webb, male   DOB: 1950/07/31, 73 y.o.   MRN: [id number]  Reason for Consult: New Patient (Initial Visit)   Referred by Bernie Lamar PARAS, MD  Subjective:     HPI:  Corey Webb is a 73 y.o. male long history of smoker, CAD, COPD currently on 3 L nasal cannula, hyperlipidemia and hypertension.  Formerly was a 3 pack/day smoker currently smoking 2 to 3 cigarettes/day.  Has been found to have carotid stenosis.  Denies any history of stroke, TIA or amaurosis.  He does not have any personal or family history of aneurysm disease.  His limitation to walking his leg pain denies frank claudication, tissue loss or ulceration.  Past Medical History:  Diagnosis Date   Arthritis    DDD   CAD in native artery 05/24/2016   Cancer (HCC)    COPD (chronic obstructive pulmonary disease) (HCC)    Dyslipidemia 01/10/2017   Essential hypertension 01/10/2017   Hyperlipidemia    Myocardial infarction [facility])    Family History  Problem Relation Age of Onset   Cancer Mother    Heart attack Father    Past Surgical History:  Procedure Laterality Date   BACK SURGERY     CATARACT EXTRACTION Bilateral    EYE SURGERY     HERNIA REPAIR Right 08/10/2019   Inquinal   NECK SURGERY     REFRACTIVE SURGERY Bilateral     Short Social History:  Social History   Tobacco Use   Smoking status: Every Day    Current packs/day: 0.25    Average packs/day: 1 pack/day for 50.0 years (48.7 ttl pk-yrs)    Types: Cigarettes    Start date: 01/28/1974   Smokeless tobacco: Never  Substance Use Topics   Alcohol use: Never    Allergies  Allergen Reactions   Atorvastatin Other (See Comments)    Leg pain Leg pain    Clonazepam Other (See Comments)   Ropinirole Other (See Comments)   Varenicline Other (See Comments)    Current Outpatient Medications  Medication Sig Dispense Refill   albuterol  (VENTOLIN  HFA) 108 (90 Base) MCG/ACT inhaler Inhale 1-2 puffs into the lungs every 6 (six)  hours as needed for wheezing or shortness of breath.     alendronate (FOSAMAX) 70 MG tablet Take 70 mg by mouth once a week.     aspirin 325 MG tablet Take 325 mg by mouth daily.     bicalutamide (CASODEX) 50 MG tablet Take 50 mg by mouth daily.     DULoxetine (CYMBALTA) 60 MG capsule Take 60 mg by mouth daily.     fluticasone (FLONASE) 50 MCG/ACT nasal spray Place 1 spray into both nostrils 2 (two) times daily.     furosemide (LASIX) 20 MG tablet Take 20 mg by mouth daily.     ipratropium-albuterol  (DUONEB) 0.5-2.5 (3) MG/3ML SOLN Take 3 mLs by nebulization every 6 (six) hours as needed (shortness of breath).     isosorbide  mononitrate (IMDUR ) 60 MG 24 hr tablet Take 1 tablet (60 mg total) by mouth daily. 90 tablet 3   mirtazapine (REMERON) 15 MG tablet Take 15 mg by mouth at bedtime.     omeprazole (PRILOSEC) 40 MG capsule Take 1 capsule by mouth daily.     oxybutynin (DITROPAN-XL) 5 MG 24 hr tablet Take 5 mg by mouth at bedtime.     oxyCODONE (ROXICODONE) 15 MG immediate release tablet Take 15 mg by mouth every 8 (eight) hours as needed  for pain.     pravastatin (PRAVACHOL) 40 MG tablet Take 40 mg by mouth daily.     solifenacin (VESICARE) 10 MG tablet Take 10 mg by mouth at bedtime.     tamsulosin  (FLOMAX ) 0.4 MG CAPS capsule TAKE 1 CAPSULE BY MOUTH 2 TIMES DAILY. 180 capsule 1   traZODone (DESYREL) 50 MG tablet Take 50 mg by mouth at bedtime.     verapamil (CALAN-SR) 180 MG CR tablet Take 180 mg by mouth at bedtime.     WIXELA INHUB 250-50 MCG/ACT AEPB Inhale 1 puff into the lungs in the morning and at bedtime.     Current Facility-Administered Medications  Medication Dose Route Frequency Provider Last Rate Last Admin   triamcinolone  acetonide (KENALOG ) 10 MG/ML injection 10 mg  10 mg Other Once Stover, Titorya, DPM        Review of Systems  Constitutional:  Constitutional negative. HENT: HENT negative.  Eyes: Eyes negative.  Respiratory: Positive for shortness of breath and  wheezing.  Cardiovascular: Positive for leg swelling.  GI: Gastrointestinal negative.  Musculoskeletal: Musculoskeletal negative.  Skin: Skin negative.  Neurological: Neurological negative. Hematologic: Hematologic/lymphatic negative.  Psychiatric: Psychiatric negative.        Objective:  Objective  Vitals:   01/29/24 1145 01/29/24 1146  BP: 122/66 112/65  Pulse: 81   Temp: 99.2 F (37.3 C)   SpO2: 91%      Physical Exam HENT:     Head: Normocephalic.     Nose: Nose normal.     Mouth/Throat:     Mouth: Mucous membranes are moist.  Eyes:     Pupils: Pupils are equal, round, and reactive to light.  Neck:     Vascular: No carotid bruit.  Cardiovascular:     Rate and Rhythm: Normal rate.     Pulses:          Femoral pulses are 2+ on the right side and 2+ on the left side.      Popliteal pulses are 0 on the right side and 2+ on the left side.       Dorsalis pedis pulses are 0 on the right side and 0 on the left side.       Posterior tibial pulses are 0 on the right side and 0 on the left side.  Pulmonary:     Effort: Pulmonary effort is normal.  Abdominal:     General: Abdomen is flat.     Palpations: Abdomen is soft. There is no mass.  Musculoskeletal:        General: Normal range of motion.     Cervical back: Normal range of motion.     Right lower leg: No edema.     Left lower leg: Edema present.  Skin:    General: Skin is warm.     Capillary Refill: Capillary refill takes more than 3 seconds.  Neurological:     General: No focal deficit present.     Mental Status: He is alert.  Psychiatric:        Mood and Affect: Mood normal.        Thought Content: Thought content normal.        Judgment: Judgment normal.     Data: BILATERAL CAROTID DUPLEX ULTRASOUND   TECHNIQUE: Elnor scale imaging, color Doppler and duplex ultrasound were performed of bilateral carotid and vertebral arteries in the neck.   COMPARISON:  None available   FINDINGS: Criteria:  Quantification of carotid stenosis is based on  velocity parameters that correlate the residual internal carotid diameter with NASCET-based stenosis levels, using the diameter of the distal internal carotid lumen as the denominator for stenosis measurement.   The following velocity measurements were obtained:   RIGHT   ICA: 47/15 cm/sec   CCA: 29/1 cm/sec   SYSTOLIC ICA/CCA RATIO:  1.6   ECA: 76 cm/sec   LEFT   ICA: 176/51 cm/sec   CCA: 127/23 cm/sec   SYSTOLIC ICA/CCA RATIO:  1.7   ECA: 218 cm/sec   RIGHT CAROTID ARTERY: Extensive calcified plaque seen throughout the common carotid artery. Long segment shadowing plaque present in the proximal internal carotid artery. Mid and distal ICA are patent, however there is a dampened upstroke.   RIGHT VERTEBRAL ARTERY:  Antegrade flow.   LEFT CAROTID ARTERY: Extensive calcified plaque seen throughout the mid and distal common carotid artery. Long segment shadowing plaque present in the proximal internal carotid artery.   LEFT VERTEBRAL ARTERY:  Antegrade flow.   IMPRESSION: 1. High-grade stenosis (greater than 70%) of the RIGHT internal carotid artery is suspected given extensive atheromatous plaque of the distal common and proximal internal carotid arteries and dampened waveforms in the mid and distal RIGHT ICA. 2. 50-69% stenosis of the LEFT internal carotid artery. 3. Further evaluation with CT angiography of the neck is recommended as long segment shadowing plaque of the proximal internal carotid arteries limits doppler evaluation.     Assessment/Plan:    73 year old male with history of what appears to be high-grade right ICA stenosis with moderate grade stenosis on the left.  Patient requires 3 L nasal cannula.  We discussed that he would be high risk for any carotid intervention.  We also discussed that any further evaluation would require CT angio.  Given that patient would not be a surgical candidate he does not  wish to proceed with any further noninvasive studies.  Patient states that he will follow-up with Dr. Krasowski for carotid stenosis and will see me as needed.      Penne Lonni Colorado MD [facility] ([place])

## 2024-02-02 DIAGNOSIS — J9611 Chronic respiratory failure with hypoxia: Secondary | ICD-10-CM | POA: Diagnosis not present

## 2024-02-02 DIAGNOSIS — J449 Chronic obstructive pulmonary disease, unspecified: Secondary | ICD-10-CM | POA: Diagnosis not present

## 2024-02-11 DIAGNOSIS — D509 Iron deficiency anemia, unspecified: Secondary | ICD-10-CM | POA: Diagnosis not present

## 2024-02-11 DIAGNOSIS — E538 Deficiency of other specified B group vitamins: Secondary | ICD-10-CM | POA: Diagnosis not present

## 2024-02-18 DIAGNOSIS — G894 Chronic pain syndrome: Secondary | ICD-10-CM | POA: Diagnosis not present

## 2024-02-18 DIAGNOSIS — M47816 Spondylosis without myelopathy or radiculopathy, lumbar region: Secondary | ICD-10-CM | POA: Diagnosis not present

## 2024-02-20 DIAGNOSIS — J9612 Chronic respiratory failure with hypercapnia: Secondary | ICD-10-CM | POA: Diagnosis not present

## 2024-02-20 DIAGNOSIS — J449 Chronic obstructive pulmonary disease, unspecified: Secondary | ICD-10-CM | POA: Diagnosis not present

## 2024-02-20 DIAGNOSIS — J9611 Chronic respiratory failure with hypoxia: Secondary | ICD-10-CM | POA: Diagnosis not present

## 2024-02-26 DIAGNOSIS — I10 Essential (primary) hypertension: Secondary | ICD-10-CM | POA: Diagnosis not present

## 2024-02-26 DIAGNOSIS — E785 Hyperlipidemia, unspecified: Secondary | ICD-10-CM | POA: Diagnosis not present

## 2024-02-26 DIAGNOSIS — I2510 Atherosclerotic heart disease of native coronary artery without angina pectoris: Secondary | ICD-10-CM | POA: Diagnosis not present

## 2024-02-26 DIAGNOSIS — E7800 Pure hypercholesterolemia, unspecified: Secondary | ICD-10-CM | POA: Diagnosis not present

## 2024-02-26 DIAGNOSIS — M47816 Spondylosis without myelopathy or radiculopathy, lumbar region: Secondary | ICD-10-CM | POA: Diagnosis not present

## 2024-02-26 DIAGNOSIS — J449 Chronic obstructive pulmonary disease, unspecified: Secondary | ICD-10-CM | POA: Diagnosis not present

## 2024-02-28 DIAGNOSIS — J449 Chronic obstructive pulmonary disease, unspecified: Secondary | ICD-10-CM | POA: Diagnosis not present

## 2024-03-03 DIAGNOSIS — J9611 Chronic respiratory failure with hypoxia: Secondary | ICD-10-CM | POA: Diagnosis not present

## 2024-03-03 DIAGNOSIS — J449 Chronic obstructive pulmonary disease, unspecified: Secondary | ICD-10-CM | POA: Diagnosis not present

## 2024-03-18 DIAGNOSIS — C61 Malignant neoplasm of prostate: Secondary | ICD-10-CM | POA: Diagnosis not present

## 2024-03-18 DIAGNOSIS — R351 Nocturia: Secondary | ICD-10-CM | POA: Diagnosis not present

## 2024-03-30 DIAGNOSIS — J449 Chronic obstructive pulmonary disease, unspecified: Secondary | ICD-10-CM | POA: Diagnosis not present

## 2024-03-30 DIAGNOSIS — J9611 Chronic respiratory failure with hypoxia: Secondary | ICD-10-CM | POA: Diagnosis not present

## 2024-03-31 ENCOUNTER — Encounter: Payer: Self-pay | Admitting: Cardiology

## 2024-03-31 ENCOUNTER — Ambulatory Visit: Attending: Cardiology | Admitting: Cardiology

## 2024-03-31 VITALS — BP 80/40 | HR 78 | Ht 67.0 in | Wt 112.8 lb

## 2024-03-31 DIAGNOSIS — J42 Unspecified chronic bronchitis: Secondary | ICD-10-CM

## 2024-03-31 DIAGNOSIS — Z79891 Long term (current) use of opiate analgesic: Secondary | ICD-10-CM | POA: Insufficient documentation

## 2024-03-31 DIAGNOSIS — E785 Hyperlipidemia, unspecified: Secondary | ICD-10-CM | POA: Diagnosis not present

## 2024-03-31 DIAGNOSIS — R0989 Other specified symptoms and signs involving the circulatory and respiratory systems: Secondary | ICD-10-CM | POA: Diagnosis not present

## 2024-03-31 DIAGNOSIS — I10 Essential (primary) hypertension: Secondary | ICD-10-CM

## 2024-03-31 DIAGNOSIS — I2510 Atherosclerotic heart disease of native coronary artery without angina pectoris: Secondary | ICD-10-CM | POA: Diagnosis not present

## 2024-03-31 MED ORDER — VERAPAMIL HCL ER 180 MG PO TBCR: 180.0000 mg | EXTENDED_RELEASE_TABLET | Freq: Every day | ORAL | 3 refills | Status: AC

## 2024-03-31 NOTE — Progress Notes (Signed)
 Cardiology Office Note:    Date:  03/31/2024   ID:  Corey Webb, DOB [birth year], MRN [id number]  PCP:  Stephanie Charlene CROME, MD  Cardiologist:  Corey Fitch, MD    Referring MD: Stephanie Charlene CROME, MD   Chief Complaint  Patient presents with   Follow-up    History of Present Illness:    Corey Webb is a 73 y.o. male past medical history significant for COPD which is advanced end-stage, on oxygen  all the time sadly still continues to smoke he said between 3 and 5 cigarettes a day, dyslipidemia, essential hypertension, chronic catheterization done 30 years ago showing obstructive lesion however CT recently did not show significant calcification stress done per after showed small area of ischemia involving midportion of the inferior wall on top of that recently carotic ultrasound has been done which showed significant stenosis on the right side.  Comes today for follow-up.  He lives very sedentary lifestyle.  He cannot do much because of shortness of breath on oxygen  all the time denies have any TIA/CVA symptoms, no chest pain tightness squeezing pressure burning chest  Past Medical History:  Diagnosis Date   Arthritis    DDD   CAD in native artery 05/24/2016   Cancer (HCC)    COPD (chronic obstructive pulmonary disease) (HCC)    Dyslipidemia 01/10/2017   Essential hypertension 01/10/2017   Hyperlipidemia    Myocardial infarction [facility])     Past Surgical History:  Procedure Laterality Date   BACK SURGERY     CATARACT EXTRACTION Bilateral    EYE SURGERY     HERNIA REPAIR Right 08/10/2019   Inquinal   NECK SURGERY     REFRACTIVE SURGERY Bilateral     Current Medications: Current Meds  Medication Sig   albuterol  (PROVENTIL ) (2.5 MG/3ML) 0.083% nebulizer solution Take 2.5 mg by nebulization every 6 (six) hours as needed for wheezing or shortness of breath.   albuterol  (VENTOLIN  HFA) 108 (90 Base) MCG/ACT inhaler Inhale 1-2 puffs into the lungs every 6 (six) hours as  needed for wheezing or shortness of breath.   alendronate (FOSAMAX) 70 MG tablet Take 70 mg by mouth once a week.   aspirin 325 MG tablet Take 325 mg by mouth daily.   azithromycin (ZITHROMAX) 500 MG tablet Take 500 mg by mouth 3 (three) times a week.   bicalutamide (CASODEX) 50 MG tablet Take 50 mg by mouth daily.   DULoxetine (CYMBALTA) 60 MG capsule Take 60 mg by mouth daily.   fluticasone (FLONASE) 50 MCG/ACT nasal spray Place 1 spray into both nostrils 2 (two) times daily.   furosemide (LASIX) 20 MG tablet Take 20 mg by mouth daily.   INCRUSE ELLIPTA 62.5 MCG/ACT AEPB Inhale 1 puff into the lungs daily.   ipratropium-albuterol  (DUONEB) 0.5-2.5 (3) MG/3ML SOLN Take 3 mLs by nebulization every 6 (six) hours as needed (shortness of breath).   isosorbide  mononitrate (IMDUR ) 60 MG 24 hr tablet Take 1 tablet (60 mg total) by mouth daily.   mirtazapine (REMERON) 15 MG tablet Take 15 mg by mouth at bedtime.   omeprazole (PRILOSEC) 40 MG capsule Take 1 capsule by mouth daily.   oxybutynin (DITROPAN-XL) 5 MG 24 hr tablet Take 5 mg by mouth at bedtime.   oxyCODONE (ROXICODONE) 15 MG immediate release tablet Take 15 mg by mouth every 8 (eight) hours as needed for pain.   pravastatin (PRAVACHOL) 40 MG tablet Take 40 mg by mouth daily.   solifenacin (VESICARE) 10 MG  tablet Take 10 mg by mouth at bedtime.   SPIRIVA HANDIHALER 18 MCG CAPS Irrigate with 1 capsule as directed daily.   tamsulosin  (FLOMAX ) 0.4 MG CAPS capsule TAKE 1 CAPSULE BY MOUTH 2 TIMES DAILY.   traZODone (DESYREL) 50 MG tablet Take 50 mg by mouth at bedtime.   verapamil (CALAN-SR) 180 MG CR tablet Take 180 mg by mouth at bedtime.   WIXELA INHUB 250-50 MCG/ACT AEPB Inhale 1 puff into the lungs in the morning and at bedtime.   Current Facility-Administered Medications for the 03/31/24 encounter (Office Visit) with Hosea Hanawalt J, MD  Medication   triamcinolone  acetonide (KENALOG ) 10 MG/ML injection 10 mg     Allergies:    Atorvastatin, Clonazepam, Ropinirole, and Varenicline   Social History   Socioeconomic History   Marital status: Married    Spouse name: Not on file   Number of children: Not on file   Years of education: Not on file   Highest education level: Not on file  Occupational History   Not on file  Tobacco Use   Smoking status: Every Day    Current packs/day: 0.25    Average packs/day: 1 pack/day for 50.2 years (48.8 ttl pk-yrs)    Types: Cigarettes    Start date: 01/28/1974   Smokeless tobacco: Never  Substance and Sexual Activity   Alcohol use: Never   Drug use: Never   Sexual activity: Not on file  Other Topics Concern   Not on file  Social History Narrative   Not on file   Social Drivers of Health   Financial Resource Strain: Low Risk (02/04/2023)   Received [facility]   Overall Financial Resource Strain (CARDIA)    Difficulty of Paying Living Expenses: Not hard at all  Food Insecurity: No Food Insecurity (02/04/2023)   Received from [facility]   Hunger Vital Sign    Within the past 12 months, you worried that your food would run out before you got the money to buy more.: Never true    Within the past 12 months, the food you bought just didn't last and you didn't have money to get more.: Never true  Transportation Needs: No Transportation Needs (02/04/2023)   Received from [facility]   PRAPARE - Transportation    Lack of Transportation (Medical): No    Lack of Transportation (Non-Medical): No  Physical Activity: Not on file  Stress: Not on file  Social Connections: Not on file     Family History: The patient's family history includes Cancer in his mother; Heart attack in his father. ROS:   Please see the history of present illness.    All 14 point review of systems negative except as described per history of present illness  EKGs/Labs/Other Studies Reviewed:         Recent Labs: No results found for requested labs within last 365 days.   Recent Lipid Panel No results found for: CHOL, TRIG, HDL, CHOLHDL, VLDL, LDLCALC, LDLDIRECT  Physical Exam:    VS:  BP (!) 80/40   Pulse 78   Ht 5' 7 (1.702 m)   Wt 112 lb 12.8 oz (51.2 kg)   SpO2 90%   BMI 17.67 kg/m     Wt Readings from Last 3 Encounters:  03/31/24 112 lb 12.8 oz (51.2 kg)  10/24/23 126 lb (57.2 kg)  03/06/23 112 lb 12.8 oz (51.2 kg)     GEN:  Well nourished, well developed in no acute distress  HEENT: Normal NECK: No JVD; No carotid bruits LYMPHATICS: No lymphadenopathy CARDIAC: RRR, no murmurs, no rubs, no gallops RESPIRATORY:  Clear to auscultation without rales, wheezing or rhonchi  ABDOMEN: Soft, non-tender, non-distended MUSCULOSKELETAL:  No edema; No deformity  SKIN: Warm and dry LOWER EXTREMITIES: no swelling NEUROLOGIC:  Alert and oriented x 3 PSYCHIATRIC:  Normal affect   ASSESSMENT:    1. CAD in native artery   2. Essential hypertension   3. Chronic bronchitis, unspecified chronic bronchitis type (HCC)   4. Left carotid bruit   5. Dyslipidemia    PLAN:    In order of problems listed above:  Coronary artery disease stress test abnormal but asymptomatic poor candidate for any acute intervention will continue present management. Essential hypertension with facing of the problem with his blood pressure being low, therefore, I will discontinue his Lasix and ask him to take it only when he gets swollen legs. COPD advise follow-up by pulmonary. Dyslipidemia I did review his KPN which show me LDL of 33 HDL 66 and he is taking pravastatin 40 which I will continue. Carotic arterial stenosis, he was referred to vascular surgeon as they felt he is a poor candidate for surgery, recommended conservative approach   Medication Adjustments/Labs and Tests Ordered: Current medicines are reviewed at length with the patient today.  Concerns regarding medicines are outlined above.  Orders Placed This Encounter  Procedures   EKG 12-Lead    Medication changes: No orders of the defined types were placed in this encounter.   Signed, Corey DOROTHA Fitch, MD, [facility] 03/31/2024 2:22 PM    [facility]

## 2024-03-31 NOTE — Patient Instructions (Signed)
 Medication Instructions:  Your physician recommends that you continue on your current medications as directed. Please refer to the Current Medication list given to you today.  *If you need a refill on your cardiac medications before your next appointment, please call your pharmacy*  Lab Work: NONE If you have labs (blood work) drawn today and your tests are completely normal, you will receive your results only by: MyChart Message (if you have MyChart) OR A paper copy in the mail If you have any lab test that is abnormal or we need to change your treatment, we will call you to review the results.  Testing/Procedures: NONE  Follow-Up: At [facility], you and your health needs are our priority.  As part of our continuing mission to provide you with exceptional heart care, our providers are all part of one team.  This team includes your primary Cardiologist (physician) and Advanced Practice Providers or APPs (Physician Assistants and Nurse Practitioners) who all work together to provide you with the care you need, when you need it.  Your next appointment:   6 month(s)  Provider:   Gypsy Balsam, MD    We recommend signing up for the patient portal called "MyChart".  Sign up information is provided on this After Visit Summary.  MyChart is used to connect with patients for Virtual Visits (Telemedicine).  Patients are able to view lab/test results, encounter notes, upcoming appointments, etc.  Non-urgent messages can be sent to your provider as well.   To learn more about what you can do with MyChart, go to [email or web address].   Other Instructions

## 2024-03-31 NOTE — Addendum Note (Signed)
 Addended by: ARLOA POTASH R on: 03/31/2024 02:31 PM   Modules accepted: Orders

## 2024-04-01 DIAGNOSIS — E785 Hyperlipidemia, unspecified: Secondary | ICD-10-CM | POA: Diagnosis not present

## 2024-04-01 DIAGNOSIS — M47816 Spondylosis without myelopathy or radiculopathy, lumbar region: Secondary | ICD-10-CM | POA: Diagnosis not present

## 2024-04-01 DIAGNOSIS — Z79891 Long term (current) use of opiate analgesic: Secondary | ICD-10-CM | POA: Diagnosis not present

## 2024-04-01 DIAGNOSIS — I10 Essential (primary) hypertension: Secondary | ICD-10-CM | POA: Diagnosis not present

## 2024-04-01 DIAGNOSIS — I2510 Atherosclerotic heart disease of native coronary artery without angina pectoris: Secondary | ICD-10-CM | POA: Diagnosis not present

## 2024-04-01 DIAGNOSIS — E7800 Pure hypercholesterolemia, unspecified: Secondary | ICD-10-CM | POA: Diagnosis not present

## 2024-04-01 DIAGNOSIS — J449 Chronic obstructive pulmonary disease, unspecified: Secondary | ICD-10-CM | POA: Diagnosis not present

## 2024-04-03 DIAGNOSIS — J449 Chronic obstructive pulmonary disease, unspecified: Secondary | ICD-10-CM | POA: Diagnosis not present

## 2024-04-19 DIAGNOSIS — M47816 Spondylosis without myelopathy or radiculopathy, lumbar region: Secondary | ICD-10-CM | POA: Diagnosis not present

## 2024-04-19 DIAGNOSIS — G894 Chronic pain syndrome: Secondary | ICD-10-CM | POA: Diagnosis not present

## 2024-04-23 DIAGNOSIS — Z79899 Other long term (current) drug therapy: Secondary | ICD-10-CM | POA: Diagnosis not present

## 2024-04-29 DIAGNOSIS — J449 Chronic obstructive pulmonary disease, unspecified: Secondary | ICD-10-CM | POA: Diagnosis not present

## 2024-04-29 DIAGNOSIS — J9611 Chronic respiratory failure with hypoxia: Secondary | ICD-10-CM | POA: Diagnosis not present

## 2024-05-03 DIAGNOSIS — J9611 Chronic respiratory failure with hypoxia: Secondary | ICD-10-CM | POA: Diagnosis not present

## 2024-05-03 DIAGNOSIS — J449 Chronic obstructive pulmonary disease, unspecified: Secondary | ICD-10-CM | POA: Diagnosis not present

## 2024-05-18 DIAGNOSIS — Z8619 Personal history of other infectious and parasitic diseases: Secondary | ICD-10-CM | POA: Diagnosis not present

## 2024-05-18 DIAGNOSIS — F1120 Opioid dependence, uncomplicated: Secondary | ICD-10-CM | POA: Diagnosis not present

## 2024-05-18 DIAGNOSIS — R600 Localized edema: Secondary | ICD-10-CM | POA: Diagnosis not present

## 2024-05-18 DIAGNOSIS — D509 Iron deficiency anemia, unspecified: Secondary | ICD-10-CM | POA: Diagnosis not present

## 2024-05-18 DIAGNOSIS — C61 Malignant neoplasm of prostate: Secondary | ICD-10-CM | POA: Diagnosis not present

## 2024-05-18 DIAGNOSIS — I739 Peripheral vascular disease, unspecified: Secondary | ICD-10-CM | POA: Diagnosis not present

## 2024-05-18 DIAGNOSIS — J449 Chronic obstructive pulmonary disease, unspecified: Secondary | ICD-10-CM | POA: Diagnosis not present

## 2024-05-18 DIAGNOSIS — Z9181 History of falling: Secondary | ICD-10-CM | POA: Diagnosis not present

## 2024-05-18 DIAGNOSIS — E7800 Pure hypercholesterolemia, unspecified: Secondary | ICD-10-CM | POA: Diagnosis not present

## 2024-05-18 DIAGNOSIS — M810 Age-related osteoporosis without current pathological fracture: Secondary | ICD-10-CM | POA: Diagnosis not present

## 2024-05-18 DIAGNOSIS — J9611 Chronic respiratory failure with hypoxia: Secondary | ICD-10-CM | POA: Diagnosis not present

## 2024-05-18 DIAGNOSIS — E538 Deficiency of other specified B group vitamins: Secondary | ICD-10-CM | POA: Diagnosis not present
